# Patient Record
Sex: Female | Born: 1955
Health system: Southern US, Community
[De-identification: ages and names within clinical notes are randomized; demographics above are authoritative.]

## PROBLEM LIST (undated history)

## (undated) DIAGNOSIS — N200 Calculus of kidney: Secondary | ICD-10-CM

## (undated) DIAGNOSIS — Z87442 Personal history of urinary calculi: Secondary | ICD-10-CM

## (undated) DIAGNOSIS — E119 Type 2 diabetes mellitus without complications: Secondary | ICD-10-CM

## (undated) DIAGNOSIS — E785 Hyperlipidemia, unspecified: Secondary | ICD-10-CM

## (undated) DIAGNOSIS — M1990 Unspecified osteoarthritis, unspecified site: Secondary | ICD-10-CM

## (undated) DIAGNOSIS — N201 Calculus of ureter: Secondary | ICD-10-CM

## (undated) HISTORY — PX: JOINT REPLACEMENT: SHX530

## (undated) HISTORY — PX: OTHER SURGICAL HISTORY: SHX169

## (undated) HISTORY — PX: CATARACT EXTRACTION W/ INTRAOCULAR LENS  IMPLANT, BILATERAL: SHX1307

---

## 2014-04-29 ENCOUNTER — Emergency Department (HOSPITAL_COMMUNITY): Payer: Self-pay

## 2014-04-29 ENCOUNTER — Emergency Department (HOSPITAL_COMMUNITY)
Admission: EM | Admit: 2014-04-29 | Discharge: 2014-04-29 | Disposition: A | Discharge status: Home / Self Care | Payer: Self-pay | Attending: Emergency Medicine | Admitting: Emergency Medicine

## 2014-04-29 ENCOUNTER — Encounter (HOSPITAL_COMMUNITY): Payer: Self-pay | Admitting: Emergency Medicine

## 2014-04-29 DIAGNOSIS — E119 Type 2 diabetes mellitus without complications: Secondary | ICD-10-CM | POA: Insufficient documentation

## 2014-04-29 DIAGNOSIS — R197 Diarrhea, unspecified: Secondary | ICD-10-CM | POA: Insufficient documentation

## 2014-04-29 DIAGNOSIS — R509 Fever, unspecified: Secondary | ICD-10-CM | POA: Insufficient documentation

## 2014-04-29 DIAGNOSIS — N201 Calculus of ureter: Secondary | ICD-10-CM | POA: Insufficient documentation

## 2014-04-29 DIAGNOSIS — R1084 Generalized abdominal pain: Secondary | ICD-10-CM | POA: Insufficient documentation

## 2014-04-29 LAB — URINE MICROSCOPIC-ADD ON: RBC / HPF: NONE SEEN RBC/hpf (ref <3)

## 2014-04-29 LAB — COMPREHENSIVE METABOLIC PANEL
ALBUMIN: 4 g/dL (ref 3.5–5.2)
ALT: 20 U/L (ref 0–35)
AST: 18 U/L (ref 0–37)
Alkaline Phosphatase: 102 U/L (ref 39–117)
Anion gap: 17 — ABNORMAL HIGH (ref 5–15)
BUN: 11 mg/dL (ref 6–23)
CO2: 25 mEq/L (ref 19–32)
CREATININE: 0.66 mg/dL (ref 0.50–1.10)
Calcium: 9.8 mg/dL (ref 8.4–10.5)
Chloride: 95 mEq/L — ABNORMAL LOW (ref 96–112)
GFR calc Af Amer: >90 mL/min (ref >90)
Glucose, Bld: 230 mg/dL — ABNORMAL HIGH (ref 70–99)
Potassium: 3.8 mEq/L (ref 3.7–5.3)
Sodium: 137 mEq/L (ref 137–147)
Total Bilirubin: 0.9 mg/dL (ref 0.3–1.2)
Total Protein: 8.3 g/dL (ref 6.0–8.3)

## 2014-04-29 LAB — CBC WITH DIFFERENTIAL/PLATELET
BASOS ABS: 0 10*3/uL (ref 0.0–0.1)
BASOS PCT: 0 % (ref 0–1)
EOS PCT: 0 % (ref 0–5)
Eosinophils Absolute: 0 10*3/uL (ref 0.0–0.7)
HCT: 35.8 % — ABNORMAL LOW (ref 36.0–46.0)
Hemoglobin: 12 g/dL (ref 12.0–15.0)
Lymphocytes Relative: 16 % (ref 12–46)
Lymphs Abs: 1.9 10*3/uL (ref 0.7–4.0)
MCH: 28.2 pg (ref 26.0–34.0)
MCHC: 33.5 g/dL (ref 30.0–36.0)
MCV: 84 fL (ref 78.0–100.0)
Monocytes Absolute: 0.6 10*3/uL (ref 0.1–1.0)
Monocytes Relative: 5 % (ref 3–12)
Neutro Abs: 9.1 10*3/uL — ABNORMAL HIGH (ref 1.7–7.7)
Neutrophils Relative %: 79 % — ABNORMAL HIGH (ref 43–77)
Platelets: 314 10*3/uL (ref 150–400)
RBC: 4.26 MIL/uL (ref 3.87–5.11)
RDW: 13.4 % (ref 11.5–15.5)
WBC: 11.6 10*3/uL — ABNORMAL HIGH (ref 4.0–10.5)

## 2014-04-29 LAB — URINALYSIS, ROUTINE W REFLEX MICROSCOPIC
Bilirubin Urine: NEGATIVE
Ketones, ur: 15 mg/dL — AB
Nitrite: NEGATIVE
PH: 5 (ref 5.0–8.0)
PROTEIN: NEGATIVE mg/dL
Specific Gravity, Urine: 1.021 (ref 1.005–1.030)
Urobilinogen, UA: 0.2 mg/dL (ref 0.0–1.0)

## 2014-04-29 LAB — CBG MONITORING, ED: Glucose-Capillary: 191 mg/dL — ABNORMAL HIGH (ref 70–99)

## 2014-04-29 MED ORDER — SODIUM CHLORIDE 0.9 % IV BOLUS (SEPSIS)
1000.0000 mL | Freq: Once | INTRAVENOUS | Status: AC
  Administered 2014-04-29: 1000 mL via INTRAVENOUS

## 2014-04-29 MED ORDER — ONDANSETRON HCL 4 MG/2ML IJ SOLN
4.0000 mg | Freq: Once | INTRAMUSCULAR | Status: AC
  Administered 2014-04-29: 4 mg via INTRAVENOUS
  Filled 2014-04-29: qty 2

## 2014-04-29 MED ORDER — ACETAMINOPHEN 325 MG PO TABS
650.0000 mg | ORAL_TABLET | Freq: Once | ORAL | Status: AC
  Administered 2014-04-29: 650 mg via ORAL
  Filled 2014-04-29: qty 2

## 2014-04-29 MED ORDER — MORPHINE SULFATE 4 MG/ML IJ SOLN
4.0000 mg | Freq: Once | INTRAMUSCULAR | Status: AC
  Administered 2014-04-29: 4 mg via INTRAVENOUS
  Filled 2014-04-29: qty 1

## 2014-04-29 MED ORDER — OXYCODONE-ACETAMINOPHEN 5-325 MG PO TABS: 1.0000 | ORAL_TABLET | Freq: Four times a day (QID) | ORAL | Status: DC | PRN

## 2014-04-29 MED ORDER — ONDANSETRON HCL 4 MG PO TABS: 4.0000 mg | ORAL_TABLET | Freq: Four times a day (QID) | ORAL | Status: DC | PRN

## 2014-04-29 MED ORDER — TAMSULOSIN HCL 0.4 MG PO CAPS: 0.4000 mg | ORAL_CAPSULE | Freq: Once | ORAL | Status: DC

## 2014-04-29 NOTE — ED Notes (Signed)
Pt unable to void at present. Family at bedside, updated with pt permission.

## 2014-04-29 NOTE — Discharge Instructions (Signed)
Ureteral Colic Ureteral colic is spasm-like pain from the kidney or the ureter. This is often caused by a kidney stone. The pain is caused by the stone trying to get through the tubes that pass your pee. HOME CARE   Drink enough fluids to keep your pee (urine) clear or pale yellow.  Strain all your pee. A strainer will be provided. Keep anything caught in the strainer and bring it to your doctor. The stone causing the pain may be very small.  Only take medicine as told by your doctor.  Follow up with your doctor as told. GET HELP RIGHT AWAY IF:   Pain is not controlled with medicine.  Pain continues or gets worse.  The pain changes and there is chest or belly (abdominal) pain.  You pass out (faint).  You cannot pee.  You keep throwing up (vomiting).  You have a temperature by mouth above 102 F (38.9 C), not controlled by medicine. MAKE SURE YOU:   Understand these instructions.  Will watch this condition.  Will get help right away if you are not doing well or get worse. Document Released: 01/26/2008 Document Revised: 11/01/2011 Document Reviewed: 01/26/2008 Exit[employer] Patient Information 2015 [employer]. This information is not intended to replace advice given to you by your health care provider. Make sure you discuss any questions you have with your health care provider.  

## 2014-04-29 NOTE — ED Notes (Signed)
Per pt sts since last night she has been having abdominal pain, N,V,D. sts hx of kidney stone and feels the same.

## 2014-04-29 NOTE — ED Provider Notes (Signed)
CSN: [id number]     Arrival date & time 04/29/14  1052 History   First MD Initiated Contact with Patient 04/29/14 1101     Chief Complaint  Patient presents with  . Abdominal Pain  . Emesis  . Diarrhea     (Consider location/radiation/quality/duration/timing/severity/associated sxs/prior Treatment) Patient is a 58 y.o. female presenting with abdominal pain, vomiting, and diarrhea. The history is provided by the patient and a relative. The history is limited by a language barrier. A language interpreter was used.  Abdominal Pain Pain location:  Generalized Pain quality: aching   Pain radiates to:  L flank and R flank (L>R) Pain severity:  Moderate Onset quality:  Gradual Duration:  12 hours Timing:  Constant Progression:  Worsening Chronicity:  New Context: previous surgery   Context: not recent illness, not sick contacts, not suspicious food intake and not trauma   Relieved by:  Nothing Worsened by:  Nothing tried Ineffective treatments:  None tried Associated symptoms: anorexia, chills, diarrhea, fatigue, fever, nausea and vomiting   Associated symptoms: no chest pain, no cough, no dysuria, no shortness of breath and no sore throat   Diarrhea:    Quality:  Watery   Number of occurrences:  3   Timing:  Intermittent Vomiting:    Quality:  Bilious material   Number of occurrences:  5   Severity:  Moderate   Duration:  12 hours   Timing:  Intermittent   Progression:  Unchanged Emesis Severity:  Moderate Duration:  12 days Timing:  Intermittent Number of daily episodes:  5 Quality:  Bilious material Progression:  Unchanged Chronicity:  New Relieved by:  Nothing Associated symptoms: abdominal pain, chills and diarrhea   Associated symptoms: no arthralgias, no headaches and no sore throat   Diarrhea Associated symptoms: abdominal pain, chills, fever and vomiting   Associated symptoms: no arthralgias, no diaphoresis and no headaches     Past Medical History   Diagnosis Date  . Diabetes mellitus without complication    History reviewed. No pertinent past surgical history. History reviewed. No pertinent family history. History  Substance Use Topics  . Smoking status: Never Smoker   . Smokeless tobacco: Not on file  . Alcohol Use: No   OB History   Grav Para Term Preterm Abortions TAB SAB Ect Mult Living                 Review of Systems  Constitutional: Positive for fever, chills and fatigue. Negative for diaphoresis, activity change and appetite change.  HENT: Negative for congestion, facial swelling, rhinorrhea and sore throat.   Eyes: Negative for photophobia and discharge.  Respiratory: Negative for cough, chest tightness and shortness of breath.   Cardiovascular: Negative for chest pain, palpitations and leg swelling.  Gastrointestinal: Positive for nausea, vomiting, abdominal pain, diarrhea and anorexia.  Endocrine: Negative for polydipsia and polyuria.  Genitourinary: Positive for flank pain. Negative for dysuria, frequency, difficulty urinating and pelvic pain.  Musculoskeletal: Negative for arthralgias, back pain, neck pain and neck stiffness.  Skin: Negative for color change and wound.  Allergic/Immunologic: Negative for immunocompromised state.  Neurological: Negative for facial asymmetry, weakness, numbness and headaches.  Hematological: Does not bruise/bleed easily.  Psychiatric/Behavioral: Negative for confusion and agitation.      Allergies  Review of patient's allergies indicates no known allergies.  Home Medications   Prior to Admission medications   Medication Sig Start Date End Date Taking? Authorizing Provider  ondansetron (ZOFRAN) 4 MG tablet Take 1 tablet (4  mg total) by mouth every 6 (six) hours as needed for nausea or vomiting. 04/29/14   Toy Cookey, MD  oxyCODONE-acetaminophen (PERCOCET) 5-325 MG per tablet Take 1-2 tablets by mouth every 6 (six) hours as needed. 04/29/14   Toy Cookey, MD   tamsulosin (FLOMAX) 0.4 MG CAPS capsule Take 1 capsule (0.4 mg total) by mouth once. 04/29/14   Toy Cookey, MD   BP 116/81  Pulse 90  Temp(Src) 99.6 F (37.6 C) (Oral)  Resp 19  Wt 169 lb 12.1 oz (77 kg)  SpO2 98% Physical Exam  Constitutional: She is oriented to person, place, and time. She appears well-developed and well-nourished. No distress.  HENT:  Head: Normocephalic and atraumatic.  Mouth/Throat: No oropharyngeal exudate.  Eyes: Pupils are equal, round, and reactive to light.  Neck: Normal range of motion. Neck supple.  Cardiovascular: Normal rate, regular rhythm and normal heart sounds.  Exam reveals no gallop and no friction rub.   No murmur heard. Pulmonary/Chest: Effort normal and breath sounds normal. No respiratory distress. She has no wheezes. She has no rales.  Abdominal: Soft. Bowel sounds are normal. She exhibits no distension and no mass. There is generalized tenderness. There is CVA tenderness (L). There is no rigidity, no rebound and no guarding.  Musculoskeletal: Normal range of motion. She exhibits no edema and no tenderness.  Neurological: She is alert and oriented to person, place, and time.  Skin: Skin is warm and dry.  Psychiatric: She has a normal mood and affect.    ED Course  Procedures (including critical care time) Labs Review Labs Reviewed  CBC WITH DIFFERENTIAL - Abnormal; Notable for the following:    WBC 11.6 (*)    HCT 35.8 (*)    Neutrophils Relative % 79 (*)    Neutro Abs 9.1 (*)    All other components within normal limits  COMPREHENSIVE METABOLIC PANEL - Abnormal; Notable for the following:    Chloride 95 (*)    Glucose, Bld 230 (*)    Anion gap 17 (*)    All other components within normal limits  URINALYSIS, ROUTINE W REFLEX MICROSCOPIC - Abnormal; Notable for the following:    APPearance HAZY (*)    Glucose, UA >1000 (*)    Hgb urine dipstick TRACE (*)    Ketones, ur 15 (*)    Leukocytes, UA SMALL (*)    All other  components within normal limits  URINE MICROSCOPIC-ADD ON - Abnormal; Notable for the following:    Squamous Epithelial / LPF FEW (*)    Bacteria, UA FEW (*)    All other components within normal limits  CBG MONITORING, ED - Abnormal; Notable for the following:    Glucose-Capillary 191 (*)    All other components within normal limits  URINE CULTURE    Imaging Review Ct Renal Stone Study  04/29/2014   CLINICAL DATA:  Abdominal pain, bilateral low back and flank pain, nausea and vomiting.  EXAM: CT RENAL STONE PROTOCOL  TECHNIQUE: Multidetector CT imaging of the abdomen and pelvis was performed following the standard protocol without intravenous contrast  COMPARISON:  None.  FINDINGS: There is moderate left hydronephrosis with perinephric stranding present. Within the renal pelvis, a calculus is present measuring 13 x 8 x 9 mm. There is an additional 6 mm calculus in the distal ureter located approximately 3 cm proximal to the ureterovesical junction. No other left-sided renal or ureteral calculi identified. No evidence of right-sided renal calculi. The bladder is unremarkable.  Unenhanced appearance of the liver, gallbladder, pancreas and spleen are unremarkable. There is a low density rounded mass emanating from the lateral aspect of the left adrenal gland and measuring approximately 2.9 cm in greatest diameter. This lesion appears to contain internal fat with Hounsfield density measurement of -27 HU. Findings are consistent with adrenal adenoma or myelolipoma.  Bowel loops are unremarkable. No free fluid or abscess is identified. No hernias are seen. No enlarged lymph nodes. Bony structures are unremarkable.  IMPRESSION: 1. Left hydronephrosis with 6 mm distal ureteral calculus as well as a calculus in the renal pelvis measuring 13 x 8 x 9 mm. 2. 2.9 cm fat containing rounded lesion emanating from the lateral left adrenal gland. This is consistent with benign adenoma or myelolipoma.   Electronically  Signed   By: Irish Lack M.D.   On: 04/29/2014 13:22     EKG Interpretation None      MDM   Final diagnoses:  Ureterolithiasis    Pt is a 58 y.o. female with Pmhx as above who presents with generalized abdominal fine bilateral flank pain, left greater than right in associated nausea, vomiting, diarrhea. She also had a fever up to 101 yesterday. She's had about 5 episodes of nonbloody, nonbilious emesis as well as 3 watery stools. She reports history of kidney stone 2 years ago in [place] as well as history of an intestinal infection. On physical exam the patient has a low-grade temperature and mild tachycardia. Blood pressure stable. She is generalized abdominal tenderness to palpation with normal bowel sounds. No rebound or guarding. She has left CVA tenderness.   CT stone study shows L hydronephrosis with a 6mm distal ureteral calculus.  Urine not grossly infected. Cr nml. WBC mildly elevated at 11.6.  Pt feeling much improved after 1 dose of morphine/zofran and has tolerated liquids. Will d/c home w/ Rx for flomax, zofran, percocet.  Have asked her to f/u with urology.  Return precautions given for new or worsening symptoms including worsening pain, fever, inability to tolerate PO.       Toy Cookey, MD 04/30/14 [phone]

## 2014-04-29 NOTE — ED Notes (Signed)
Patient transported to XRAY 

## 2014-04-29 NOTE — ED Notes (Signed)
Pt reports lower back pain to right and left side, along with painful urination causing nausea and vomiting since last night. Pt states history of kidney infections.

## 2014-04-30 LAB — URINE CULTURE
Colony Count: 60000
Special Requests: NORMAL

## 2014-05-01 ENCOUNTER — Encounter (HOSPITAL_COMMUNITY): Payer: Self-pay | Admitting: Emergency Medicine

## 2014-05-01 ENCOUNTER — Emergency Department (HOSPITAL_COMMUNITY)
Admission: EM | Admit: 2014-05-01 | Discharge: 2014-05-01 | Disposition: A | Discharge status: Home / Self Care | Payer: Self-pay | Attending: Emergency Medicine | Admitting: Emergency Medicine

## 2014-05-01 DIAGNOSIS — N200 Calculus of kidney: Secondary | ICD-10-CM

## 2014-05-01 DIAGNOSIS — E119 Type 2 diabetes mellitus without complications: Secondary | ICD-10-CM | POA: Insufficient documentation

## 2014-05-01 DIAGNOSIS — R1031 Right lower quadrant pain: Secondary | ICD-10-CM | POA: Insufficient documentation

## 2014-05-01 DIAGNOSIS — Z79899 Other long term (current) drug therapy: Secondary | ICD-10-CM | POA: Insufficient documentation

## 2014-05-01 DIAGNOSIS — R112 Nausea with vomiting, unspecified: Secondary | ICD-10-CM | POA: Insufficient documentation

## 2014-05-01 LAB — CBC WITH DIFFERENTIAL/PLATELET
BASOS ABS: 0 10*3/uL (ref 0.0–0.1)
Basophils Relative: 0 % (ref 0–1)
EOS ABS: 0 10*3/uL (ref 0.0–0.7)
EOS PCT: 0 % (ref 0–5)
HCT: 31.3 % — ABNORMAL LOW (ref 36.0–46.0)
Hemoglobin: 10.6 g/dL — ABNORMAL LOW (ref 12.0–15.0)
Lymphocytes Relative: 6 % — ABNORMAL LOW (ref 12–46)
Lymphs Abs: 0.7 10*3/uL (ref 0.7–4.0)
MCH: 27.7 pg (ref 26.0–34.0)
MCHC: 33.9 g/dL (ref 30.0–36.0)
MCV: 81.9 fL (ref 78.0–100.0)
Monocytes Absolute: 0.6 10*3/uL (ref 0.1–1.0)
Monocytes Relative: 6 % (ref 3–12)
Neutro Abs: 9.8 10*3/uL — ABNORMAL HIGH (ref 1.7–7.7)
Neutrophils Relative %: 88 % — ABNORMAL HIGH (ref 43–77)
Platelets: 312 10*3/uL (ref 150–400)
RBC: 3.82 MIL/uL — ABNORMAL LOW (ref 3.87–5.11)
RDW: 13.2 % (ref 11.5–15.5)
WBC: 11.1 10*3/uL — AB (ref 4.0–10.5)

## 2014-05-01 LAB — COMPREHENSIVE METABOLIC PANEL
ALT: 20 U/L (ref 0–35)
AST: 24 U/L (ref 0–37)
Albumin: 3.1 g/dL — ABNORMAL LOW (ref 3.5–5.2)
Alkaline Phosphatase: 131 U/L — ABNORMAL HIGH (ref 39–117)
Anion gap: 17 — ABNORMAL HIGH (ref 5–15)
BUN: 12 mg/dL (ref 6–23)
CALCIUM: 9.6 mg/dL (ref 8.4–10.5)
CO2: 24 mEq/L (ref 19–32)
Chloride: 92 mEq/L — ABNORMAL LOW (ref 96–112)
Creatinine, Ser: 0.81 mg/dL (ref 0.50–1.10)
GFR calc Af Amer: >90 mL/min (ref >90)
GFR calc non Af Amer: 79 mL/min — ABNORMAL LOW (ref >90)
Glucose, Bld: 438 mg/dL — ABNORMAL HIGH (ref 70–99)
Potassium: 4.3 mEq/L (ref 3.7–5.3)
Sodium: 133 mEq/L — ABNORMAL LOW (ref 137–147)
TOTAL PROTEIN: 7.7 g/dL (ref 6.0–8.3)
Total Bilirubin: 0.6 mg/dL (ref 0.3–1.2)

## 2014-05-01 LAB — URINALYSIS, ROUTINE W REFLEX MICROSCOPIC
Bilirubin Urine: NEGATIVE
Ketones, ur: 15 mg/dL — AB
LEUKOCYTES UA: NEGATIVE
Nitrite: NEGATIVE
Protein, ur: NEGATIVE mg/dL
SPECIFIC GRAVITY, URINE: 1.03 (ref 1.005–1.030)
Urobilinogen, UA: 0.2 mg/dL (ref 0.0–1.0)
pH: 5.5 (ref 5.0–8.0)

## 2014-05-01 LAB — URINE MICROSCOPIC-ADD ON

## 2014-05-01 MED ORDER — OXYCODONE-ACETAMINOPHEN 5-325 MG PO TABS
1.0000 | ORAL_TABLET | Freq: Once | ORAL | Status: AC
  Administered 2014-05-01: 1 via ORAL
  Filled 2014-05-01: qty 1

## 2014-05-01 MED ORDER — ONDANSETRON 4 MG PO TBDP
4.0000 mg | ORAL_TABLET | Freq: Once | ORAL | Status: AC
  Administered 2014-05-01: 4 mg via ORAL
  Filled 2014-05-01: qty 1

## 2014-05-01 MED ORDER — ONDANSETRON 4 MG PO TBDP
8.0000 mg | ORAL_TABLET | Freq: Once | ORAL | Status: AC
  Administered 2014-05-01: 8 mg via ORAL
  Filled 2014-05-01: qty 2

## 2014-05-01 NOTE — ED Notes (Signed)
The pt is c/o lt flank pain since Sunday  With nv.  She was seen here Sunday and dxd with kidney stones.  Her pain has continued and she has been vomiting with chills

## 2014-05-01 NOTE — Discharge Instructions (Signed)
Return to the emergency room with worsening of symptoms, new symptoms or with symptoms that are concerning, especially worsening pain, fevers, inability to keep down fluids, foul smelling urine. Continue to take medications as prescribed.  Follow up with [facility] as soon as possible. Call the above number to make appointment.

## 2014-05-01 NOTE — ED Provider Notes (Signed)
CSN: [id number]     Arrival date & time 05/01/14  1545 History   First MD Initiated Contact with Patient 05/01/14 1926     Chief Complaint  Patient presents with  . Flank Pain    HPI Interpretor used. Brinae Woods is a 58 y.o. female with PMH of nephrolithiasis and DM presenting with fever, chills with increasing left lower back pain, nausea and two episodes of vomiting. Pain comes and goes and is relieved by pain meds but it returns quickly. Pain better with lying down. Patient reports fever was 104 at home and patient took acetaminophen and NSAIDS. Patient temperature is 99.1 in ED. Patient endorses pain with urination but no hematuria or foul smelling odor. Patient was seen in ED two days ago with CT showing nephrolithiasis. Patient instructed to follow up with urology and has not done so.  Patient denies CP, abdominal pain, change in stool.    Past Medical History  Diagnosis Date  . Diabetes mellitus without complication    History reviewed. No pertinent past surgical history. No family history on file. History  Substance Use Topics  . Smoking status: Never Smoker   . Smokeless tobacco: Not on file  . Alcohol Use: No   OB History   Grav Para Term Preterm Abortions TAB SAB Ect Mult Living                 Review of Systems  Constitutional: Negative for fever and chills.  HENT: Negative for congestion and rhinorrhea.   Eyes: Negative for visual disturbance.  Respiratory: Negative for cough and shortness of breath.   Cardiovascular: Negative for chest pain and palpitations.  Gastrointestinal: Positive for nausea and vomiting. Negative for diarrhea.  Genitourinary: Positive for dysuria and flank pain. Negative for hematuria.  Musculoskeletal: Positive for back pain. Negative for gait problem.  Skin: Negative for rash.  Neurological: Negative for weakness and headaches.      Allergies  Review of patient's allergies indicates no known allergies.  Home Medications   Prior  to Admission medications   Medication Sig Start Date End Date Taking? Authorizing Provider  ondansetron (ZOFRAN) 4 MG tablet Take 1 tablet (4 mg total) by mouth every 6 (six) hours as needed for nausea or vomiting. 04/29/14   Toy Cookey, MD  oxyCODONE-acetaminophen (PERCOCET) 5-325 MG per tablet Take 1-2 tablets by mouth every 6 (six) hours as needed. 04/29/14   Toy Cookey, MD  tamsulosin (FLOMAX) 0.4 MG CAPS capsule Take 1 capsule (0.4 mg total) by mouth once. 04/29/14   Toy Cookey, MD   BP 113/93  Pulse 106  Temp(Src) 98.9 F (37.2 C) (Oral)  Resp 16  SpO2 97% Physical Exam  Nursing note and vitals reviewed. Constitutional: She appears well-developed and well-nourished. No distress.  HENT:  Head: Normocephalic and atraumatic.  Eyes: Conjunctivae and EOM are normal. Right eye exhibits no discharge. Left eye exhibits no discharge. No scleral icterus.  Cardiovascular: Normal rate, regular rhythm and normal heart sounds.   Pulmonary/Chest: Effort normal and breath sounds normal. No respiratory distress. She has no wheezes.  Abdominal: Soft. Bowel sounds are normal.  Mild tenderness to right lower abdomen. +BS. No rebound or guarding. Left back tenderness but no CVA tenderness. No midline tenderness.  Musculoskeletal: Normal range of motion. She exhibits no tenderness.  Neurological: She is alert. She exhibits normal muscle tone. Coordination normal.  Skin: Skin is warm and dry. She is not diaphoretic.  Psychiatric: She has a normal mood and affect. Her  behavior is normal.    ED Course  Procedures (including critical care time) Labs Review Labs Reviewed  CBC WITH DIFFERENTIAL - Abnormal; Notable for the following:    WBC 11.1 (*)    RBC 3.82 (*)    Hemoglobin 10.6 (*)    HCT 31.3 (*)    Neutrophils Relative % 88 (*)    Neutro Abs 9.8 (*)    Lymphocytes Relative 6 (*)    All other components within normal limits  COMPREHENSIVE METABOLIC PANEL - Abnormal; Notable for the  following:    Sodium 133 (*)    Chloride 92 (*)    Glucose, Bld 438 (*)    Albumin 3.1 (*)    Alkaline Phosphatase 131 (*)    GFR calc non Af Amer 79 (*)    Anion gap 17 (*)    All other components within normal limits  URINALYSIS, ROUTINE W REFLEX MICROSCOPIC - Abnormal; Notable for the following:    Glucose, UA >1000 (*)    Hgb urine dipstick TRACE (*)    Ketones, ur 15 (*)    All other components within normal limits  URINE MICROSCOPIC-ADD ON    Imaging Review No results found.   EKG Interpretation None     Meds given in ED:  Medications  ondansetron (ZOFRAN-ODT) disintegrating tablet 8 mg (8 mg Oral Given 05/01/14 1638)  ondansetron (ZOFRAN-ODT) disintegrating tablet 4 mg (4 mg Oral Given 05/01/14 2034)  oxyCODONE-acetaminophen (PERCOCET/ROXICET) 5-325 MG per tablet 1-2 tablet (1 tablet Oral Given 05/01/14 2034)    Discharge Medication List as of 05/01/2014  9:15 PM        MDM   Final diagnoses:  Nephrolithiasis   Baudelia Birnie is a 58 y.o. female with PMH of nephrolithiasis, DM presenting with fevers, chills and worsening left flank pain. VSS. Patient afebrile in ED. Patient with flank pain but no CVA tenderness. Abdomen soft. Patient seen 04/29/14 for similar pain and CT showed two calculus in ureter and renal pelvis. Patient given Zofran and percocet in ED and tolerated fluids well. UA without signs of infection. Labs largely unchanged from 9/7. Patient is afebrile, nontoxic, and in no acute distress. Patient is appropriate for outpatient management and is stable for discharge. Continue to take Flomax, zofran and percocet at home and schedule urology appointment as soon as possible.    Discussed return precautions with patient. Discussed all results and patient verbalizes understanding and agrees with plan.  Case has been discussed with Dr. Judd Lien who agrees with the above plan to discharge.      Louann Sjogren, PA-C 05/01/14 2312

## 2014-05-02 NOTE — ED Provider Notes (Signed)
Medical screening examination/treatment/procedure(s) were performed by non-physician practitioner and as supervising physician I was immediately available for consultation/collaboration.     Lajada Janes, MD 05/02/14 0116 

## 2014-05-03 ENCOUNTER — Encounter (HOSPITAL_COMMUNITY): Payer: Self-pay | Admitting: *Deleted

## 2014-05-03 ENCOUNTER — Encounter (HOSPITAL_COMMUNITY)
Admission: AD | Disposition: A | Discharge status: Home / Self Care | Payer: Self-pay | Source: Ambulatory Visit | Attending: Urology

## 2014-05-03 ENCOUNTER — Ambulatory Visit (HOSPITAL_COMMUNITY)
Admission: AD | Admit: 2014-05-03 | Discharge: 2014-05-03 | Disposition: A | Discharge status: Home / Self Care | Payer: Self-pay | Source: Ambulatory Visit | Attending: Urology | Admitting: Urology

## 2014-05-03 ENCOUNTER — Other Ambulatory Visit: Payer: Self-pay | Admitting: Urology

## 2014-05-03 ENCOUNTER — Encounter (HOSPITAL_COMMUNITY): Payer: Self-pay | Admitting: Anesthesiology

## 2014-05-03 ENCOUNTER — Ambulatory Visit (HOSPITAL_COMMUNITY): Payer: Self-pay | Admitting: Anesthesiology

## 2014-05-03 DIAGNOSIS — E785 Hyperlipidemia, unspecified: Secondary | ICD-10-CM | POA: Insufficient documentation

## 2014-05-03 DIAGNOSIS — N200 Calculus of kidney: Secondary | ICD-10-CM

## 2014-05-03 DIAGNOSIS — H409 Unspecified glaucoma: Secondary | ICD-10-CM | POA: Insufficient documentation

## 2014-05-03 DIAGNOSIS — D649 Anemia, unspecified: Secondary | ICD-10-CM | POA: Insufficient documentation

## 2014-05-03 DIAGNOSIS — E119 Type 2 diabetes mellitus without complications: Secondary | ICD-10-CM | POA: Insufficient documentation

## 2014-05-03 DIAGNOSIS — I10 Essential (primary) hypertension: Secondary | ICD-10-CM | POA: Insufficient documentation

## 2014-05-03 DIAGNOSIS — Z79899 Other long term (current) drug therapy: Secondary | ICD-10-CM | POA: Insufficient documentation

## 2014-05-03 DIAGNOSIS — N201 Calculus of ureter: Secondary | ICD-10-CM | POA: Insufficient documentation

## 2014-05-03 HISTORY — DX: Unspecified osteoarthritis, unspecified site: M19.90

## 2014-05-03 HISTORY — PX: CYSTOSCOPY W/ URETERAL STENT PLACEMENT: SHX1429

## 2014-05-03 LAB — GLUCOSE, CAPILLARY
Glucose-Capillary: 197 mg/dL — ABNORMAL HIGH (ref 70–99)
Glucose-Capillary: 172 mg/dL — ABNORMAL HIGH (ref 70–99)

## 2014-05-03 SURGERY — CYSTOSCOPY, WITH RETROGRADE PYELOGRAM AND URETERAL STENT INSERTION
Anesthesia: General | Site: Ureter | Laterality: Left

## 2014-05-03 MED ORDER — FENTANYL CITRATE 0.05 MG/ML IJ SOLN
INTRAMUSCULAR | Status: AC
  Filled 2014-05-03: qty 2

## 2014-05-03 MED ORDER — LIDOCAINE HCL (CARDIAC) 20 MG/ML IV SOLN
INTRAVENOUS | Status: AC
  Filled 2014-05-03: qty 5

## 2014-05-03 MED ORDER — BELLADONNA ALKALOIDS-OPIUM 16.2-60 MG RE SUPP
RECTAL | Status: AC
  Filled 2014-05-03: qty 1

## 2014-05-03 MED ORDER — PROPOFOL 10 MG/ML IV BOLUS
INTRAVENOUS | Status: AC
  Filled 2014-05-03: qty 20

## 2014-05-03 MED ORDER — FENTANYL CITRATE 0.05 MG/ML IJ SOLN
25.0000 ug | INTRAMUSCULAR | Status: DC | PRN
  Administered 2014-05-03: 25 ug via INTRAVENOUS

## 2014-05-03 MED ORDER — PROMETHAZINE HCL 25 MG/ML IJ SOLN: 6.2500 mg | INTRAMUSCULAR | Status: DC | PRN

## 2014-05-03 MED ORDER — CIPROFLOXACIN HCL 500 MG PO TABS: 500.0000 mg | ORAL_TABLET | Freq: Two times a day (BID) | ORAL | Status: AC

## 2014-05-03 MED ORDER — TROSPIUM CHLORIDE ER 60 MG PO CP24: 60.0000 mg | ORAL_CAPSULE | Freq: Every day | ORAL | Status: DC

## 2014-05-03 MED ORDER — GENTAMICIN SULFATE 40 MG/ML IJ SOLN
5.0000 mg/kg | INTRAVENOUS | Status: AC
  Administered 2014-05-03: 290 mg via INTRAVENOUS
  Filled 2014-05-03: qty 7.25

## 2014-05-03 MED ORDER — LIDOCAINE HCL (CARDIAC) 20 MG/ML IV SOLN
INTRAVENOUS | Status: DC | PRN
  Administered 2014-05-03: 50 mg via INTRAVENOUS

## 2014-05-03 MED ORDER — FENTANYL CITRATE 0.05 MG/ML IJ SOLN
INTRAMUSCULAR | Status: DC | PRN
  Administered 2014-05-03 (×2): 50 ug via INTRAVENOUS

## 2014-05-03 MED ORDER — IOHEXOL 300 MG/ML  SOLN
INTRAMUSCULAR | Status: DC | PRN
  Administered 2014-05-03: 10 mL via INTRAVENOUS

## 2014-05-03 MED ORDER — PHENAZOPYRIDINE HCL 200 MG PO TABS: 200.0000 mg | ORAL_TABLET | Freq: Three times a day (TID) | ORAL | Status: DC | PRN

## 2014-05-03 MED ORDER — MIDAZOLAM HCL 2 MG/2ML IJ SOLN
INTRAMUSCULAR | Status: AC
  Filled 2014-05-03: qty 2

## 2014-05-03 MED ORDER — PROPOFOL 10 MG/ML IV BOLUS
INTRAVENOUS | Status: DC | PRN
  Administered 2014-05-03: 160 mg via INTRAVENOUS

## 2014-05-03 MED ORDER — MIDAZOLAM HCL 5 MG/5ML IJ SOLN
INTRAMUSCULAR | Status: DC | PRN
  Administered 2014-05-03: 2 mg via INTRAVENOUS

## 2014-05-03 MED ORDER — ONDANSETRON HCL 4 MG/2ML IJ SOLN
INTRAMUSCULAR | Status: AC
  Filled 2014-05-03: qty 2

## 2014-05-03 MED ORDER — ONDANSETRON HCL 4 MG/2ML IJ SOLN
INTRAMUSCULAR | Status: DC | PRN
  Administered 2014-05-03: 4 mg via INTRAVENOUS

## 2014-05-03 MED ORDER — LACTATED RINGERS IV SOLN
INTRAVENOUS | Status: DC
  Administered 2014-05-03: 1000 mL via INTRAVENOUS

## 2014-05-03 MED ORDER — BELLADONNA ALKALOIDS-OPIUM 16.2-60 MG RE SUPP
RECTAL | Status: DC | PRN
  Administered 2014-05-03: 1 via RECTAL

## 2014-05-03 SURGICAL SUPPLY — 15 items
BAG URO CATCHER STRL LF (DRAPE) ×4 IMPLANT
CATH URET 5FR 28IN CONE TIP (BALLOONS)
CATH URET 5FR 28IN OPEN ENDED (CATHETERS) ×4 IMPLANT
CATH URET 5FR 70CM CONE TIP (BALLOONS) IMPLANT
CLOTH BEACON ORANGE TIMEOUT ST (SAFETY) ×4 IMPLANT
DRAPE CAMERA CLOSED 9X96 (DRAPES) ×4 IMPLANT
GLOVE BIOGEL M STRL SZ7.5 (GLOVE) ×4 IMPLANT
GOWN STRL REUS W/TWL XL LVL3 (GOWN DISPOSABLE) ×4 IMPLANT
GUIDEWIRE STR DUAL SENSOR (WIRE) ×4 IMPLANT
MANIFOLD NEPTUNE II (INSTRUMENTS) ×4 IMPLANT
PACK CYSTO (CUSTOM PROCEDURE TRAY) ×4 IMPLANT
STENT URET 6FRX24 CONTOUR (STENTS) ×4 IMPLANT
TUBING CONNECTING 10 (TUBING) ×3 IMPLANT
TUBING CONNECTING 10' (TUBING) ×1
WIRE COONS/BENSON .038X145CM (WIRE) IMPLANT

## 2014-05-03 NOTE — Interval H&P Note (Signed)
History and Physical Interval Note: Left stent placement. 05/03/2014 4:51 PM  Laura Craig  has presented today for surgery, with the diagnosis of left ureteral stones  The various methods of treatment have been discussed with the patient and family. After consideration of risks, benefits and other options for treatment, the patient has consented to  Procedure(s): CYSTOSCOPY WITH DOUBLE J STENT PLACEMENT (Left) as a surgical intervention .  The patient's history has been reviewed, patient examined, no change in status, stable for surgery.  I have reviewed the patient's chart and labs.  Questions were answered to the patient's satisfaction.     Berniece Salines W

## 2014-05-03 NOTE — H&P (Signed)
History of Present Illness 58 year old female, native of [place] who has been in the [place] for one month comes in today with a three-day history of left flank pain, nausea, vomiting and fever. The patient has presented twice to the emergency room at [facility] within the past few days. She has been diagnosed with a 6 mm left distal ureteral stone as well as a larger left renal pelvic stone. Apparently, 2-3 years ago while in [place], she saw a urologist who could not treat the stone-she had an anesthetic procedure which apparently was aborted. There is no long-standing history of kidney stones. She has been treated for several urinary tract infections over the past year or 2. She denies gross hematuria.    She is diabetic. There is no history of cardiac disease. Apparently, there is no history of hypertension, although her son-in-law, who accompanies her today, says that her blood pressure "goes up and down".   Past Medical History Problems  1. History of diabetes mellitus (V12.29) 2. History of glaucoma (V12.49) 3. History of hypercholesterolemia (V12.29) 4. History of hypertension (V12.59)  Surgical History Problems  1. History of Eye Surgery 2. History of Lithotripsy  Current Meds 1. Flomax 0.4 MG Oral Capsule;  Therapy: (Recorded:11Sep2015) to Recorded 2. MetFORMIN HCl TABS;  Therapy: (Recorded:11Sep2015) to Recorded 3. Ondansetron 4 MG Oral Tablet Dispersible;  Therapy: (Recorded:11Sep2015) to Recorded 4. Oxycodone-Acetaminophen TABS;  Therapy: (Recorded:11Sep2015) to Recorded  Allergies Medication  1. No Known Drug Allergies  Family History Problems  1. Family history of kidney stones (V18.69) : Father  Social History Problems    Denied: History of Alcohol use   Denied: History of Caffeine use   Married   Never a smoker   Number of children   2 sons, 4 daughters  Review of Systems  Genitourinary: urinary frequency, urinary urgency, dysuria,  nocturia and urinary hesitancy.  Gastrointestinal: nausea, vomiting, diarrhea and constipation.  Constitutional: fever, night sweats and feeling tired (fatigue).  Musculoskeletal: back pain and joint pain.  Neurological: dizziness and headache.    Vitals Vital Signs [Data Includes: Last 1 Day]  Recorded: 11Sep2015 10:09AM  Height: 5 ft  Weight: 169 lb 12.05 oz BMI Calculated: 33.15 BSA Calculated: 1.74 Blood Pressure: 144 / 83 Temperature: 98.6 F Heart Rate: 108  Physical Exam Constitutional: Well nourished and well developed . In acute distress.  ENT:. The ears and nose are normal in appearance.  Neck: The appearance of the neck is normal and no neck mass is present.  Pulmonary: No respiratory distress and normal respiratory rhythm and effort.  Cardiovascular: Heart rate and rhythm are normal . No peripheral edema.  Abdomen: The abdomen is mildly obese. Moderate tenderness in the LUQ is present and tenderness in the LLQ is present. moderate left CVA tenderness.  Lymphatics: The femoral and inguinal nodes are not enlarged or tender.  Skin: Normal skin turgor, no visible rash and no visible skin lesions.  Neuro/Psych:. Mood and affect are appropriate.    Results/Data Urine [Data Includes: Last 1 Day]   11Sep2015  COLOR YELLOW   APPEARANCE CLOUDY   SPECIFIC GRAVITY <1.005   pH 6.0   GLUCOSE > 1000 mg/dL  BILIRUBIN NEG   KETONE 15 mg/dL  BLOOD SMALL   PROTEIN 30 mg/dL  UROBILINOGEN 0.2 mg/dL  NITRITE NEG   LEUKOCYTE ESTERASE TRACE   SQUAMOUS EPITHELIAL/HPF RARE   WBC TNTC WBC/hpf  RBC 0-2 RBC/hpf  BACTERIA MODERATE   CRYSTALS NONE SEEN   CASTS NONE  SEEN    Urinalysis appears infected. CT images were reviewed with the patient and her son-in-law. There is a 10 mm left renal pelvic stone, mild left hydroureteronephrosis down to a 6 mm distal left ureteral stone.   Assessment Assessed  1. Nephrolithiasis (592.0) 2. Left ureteral calculus (592.1) 3. Pyuria  (791.9)  1. Left distal ureteral stone, symptomatic with pyuria and hydronephrosis    2. Left renal pelvic stone   Plan Health Maintenance  1. UA With REFLEX; [Do Not Release]; Status:Complete;   Done: 11Sep2015 09:45AM Pyuria  2. Administer: CefTRIAXone Sodium 500 MG Injection Solution Reconstituted;  given  IM LUOQ; To Be Done: 11Sep2015 3. Follow-up Schedule Surgery Office  Follow-up  Status: Hold For - Appointment   Requested for: 11Sep2015  Discussion/Summary With the patient's recent history of fever, left flank pain and obstructing left ureteral stone, I have recommended that we urgently place a double-J stent. She will be given Rocephin here in the office. I have spoken with both the patient and her son-in-law about placing a stent now, and performing stone management after given a chance to treat the infection and adequate stent placement. A brochure about stent placement was given to the patient. They seem to understand what is going on.    I will have Dr. Marlou Porch place the stent, I will follow up with treatment down the road.

## 2014-05-03 NOTE — Discharge Instructions (Signed)
DISCHARGE INSTRUCTIONS FOR KIDNEY STONE/URETERAL STENT   MEDICATIONS:  1.  Resume all your other meds from home - except do not take any extra narcotic pain meds that you may have at home.  2. Trospium is to prevent bladder spasms and help reduce urinary frequency. 3. Pyridium is to help with the burning/stinging when you urinate. 4. Tylenol with codeine is for moderate/severe pain, otherwise taking upto  every 6 hours of plainTylenol will help treat your pain.  Do not take both at the same time. 5. Take Cipro one hour prior to removal of your stent.   ACTIVITY:  1. No strenuous activity x 1week  2. No driving while on narcotic pain medications  3. Drink plenty of water  4. Continue to walk at home - you can still get blood clots when you are at home, so keep active, but don't over do it.  5. May return to work/school tomorrow or when you feel ready   BATHING:  1. You can shower and we recommend daily showers  2. You have a string coming from your urethra: The stent string is attached to your ureteral stent. Do not pull on this.   SIGNS/SYMPTOMS TO CALL:  Please call us if you have a fever greater than 101.5, uncontrolled nausea/vomiting, uncontrolled pain, dizziness, unable to urinate, bloody urine, chest pain, shortness of breath, leg swelling, leg pain, redness around wound, drainage from wound, or any other concerns or questions.   You can reach [place] at [phone].   FOLLOW-UP:  1. You have follow-up scheduled with Dr. Retta Diones on 05/20/14 at 12:30pm

## 2014-05-03 NOTE — Anesthesia Preprocedure Evaluation (Addendum)
Anesthesia Evaluation  Patient identified by MRN, date of birth, ID band Patient awake    Reviewed: Allergy & Precautions, H&P , NPO status , Patient's Chart, lab work & pertinent test results  Airway Mallampati: II TM Distance: >3 FB Neck ROM: Full    Dental no notable dental hx.    Pulmonary neg pulmonary ROS,  breath sounds clear to auscultation  Pulmonary exam normal       Cardiovascular hypertension, Rhythm:Regular Rate:Normal  Probable untreated htn   Neuro/Psych negative neurological ROS  negative psych ROS   GI/Hepatic negative GI ROS, Neg liver ROS,   Endo/Other  diabetes  Renal/GU negative Renal ROS  negative genitourinary   Musculoskeletal negative musculoskeletal ROS (+)   Abdominal   Peds negative pediatric ROS (+)  Hematology  (+) anemia ,   Anesthesia Other Findings   Reproductive/Obstetrics negative OB ROS                          Anesthesia Physical Anesthesia Plan  ASA: III  Anesthesia Plan: General   Post-op Pain Management:    Induction: Intravenous  Airway Management Planned: Oral ETT and LMA  Additional Equipment:   Intra-op Plan:   Post-operative Plan: Extubation in OR  Informed Consent: I have reviewed the patients History and Physical, chart, labs and discussed the procedure including the risks, benefits and alternatives for the proposed anesthesia with the patient or authorized representative who has indicated his/her understanding and acceptance.   Dental advisory given  Plan Discussed with: CRNA and Surgeon  Anesthesia Plan Comments:        Anesthesia Quick Evaluation

## 2014-05-03 NOTE — Transfer of Care (Signed)
Immediate Anesthesia Transfer of Care Note  Patient: Laura Craig  Procedure(s) Performed: Procedure(s): CYSTOSCOPY WITH RETROGRADE PYELOGRAM/URETERAL STENT PLACEMENT (Left)  Patient Location: PACU  Anesthesia Type:General  Level of Consciousness: awake, alert  and oriented  Airway & Oxygen Therapy: Patient Spontanous Breathing and Patient connected to face mask oxygen  Post-op Assessment: Report given to PACU RN and Post -op Vital signs reviewed and stable  Post vital signs: Reviewed and stable  Complications: No apparent anesthesia complications

## 2014-05-03 NOTE — Op Note (Signed)
Preoperative diagnosis:  1. Left obstructing ureteral stone with fever   Postoperative diagnosis:  1. As above   Procedure:  1. Cystoscopy 2. left ureteral stent placement 3. left retrograde pyelography with interpretation   Surgeon: Crist Fat, MD  Anesthesia: General  Complications: None  Intraoperative findings: mild nephrotic drip, urine was clear coming behind stone.  24cm x 79F stent placed without difficulty.  EBL: Minimal  Specimens: urine culture from left renal pelvis  Indication: Laura Craig is a 58 y.o. patient with Left obstructing ureteral stone with fever and pain. After reviewing the management options for treatment, she elected to proceed with the above surgical procedure(s). We have discussed the potential benefits and risks of the procedure, side effects of the proposed treatment, the likelihood of the patient achieving the goals of the procedure, and any potential problems that might occur during the procedure or recuperation. Informed consent has been obtained.  Description of procedure:  The patient was taken to the operating room and general anesthesia was induced.  The patient was placed in the dorsal lithotomy position, prepped and draped in the usual sterile fashion, and preoperative antibiotics were administered. A preoperative time-out was performed.   Cystourethroscopy was performed.  The patient's urethra was examined and was normal. The bladder was then systematically examined in its entirety. There was no evidence for any bladder tumors, stones, or other mucosal pathology.    Attention then turned to the left ureteral orifice and a ureteral catheter was used to intubate the ureteral orifice and a urine culture obtained by aspirating urine from behind the stone.  Omnipaque contrast was then injected through the ureteral catheter and a retrograde pyelogram was performed with findings as dictated above.  A 0.38 sensor guidewire was then advanced  up the left ureter into the renal pelvis under fluoroscopic guidance.  The wire was then backloaded through the cystoscope and a ureteral stent was advance over the wire using Seldinger technique.  The stent was positioned appropriately under fluoroscopic and cystoscopic guidance.  The wire was then removed with an adequate stent curl noted in the renal pelvis as well as in the bladder.  The bladder was then emptied and the procedure ended.  The patient appeared to tolerate the procedure well and without complications.  The patient was able to be awakened and transferred to the recovery unit in satisfactory condition.    Crist Fat, M.D.

## 2014-05-04 LAB — URINE CULTURE
CULTURE: NO GROWTH
Colony Count: NO GROWTH

## 2014-05-06 ENCOUNTER — Encounter (HOSPITAL_COMMUNITY): Payer: Self-pay | Admitting: Urology

## 2014-05-08 NOTE — Anesthesia Postprocedure Evaluation (Signed)
  Anesthesia Post-op Note  Patient: Laura Craig  Procedure(s) Performed: Procedure(s) (LRB): CYSTOSCOPY WITH RETROGRADE PYELOGRAM/URETERAL STENT PLACEMENT (Left)  Patient Location: PACU  Anesthesia Type: General  Level of Consciousness: awake and alert   Airway and Oxygen Therapy: Patient Spontanous Breathing  Post-op Pain: mild  Post-op Assessment: Post-op Vital signs reviewed, Patient's Cardiovascular Status Stable, Respiratory Function Stable, Patent Airway and No signs of Nausea or vomiting  Last Vitals:  Filed Vitals:   05/03/14 1853  BP: 117/54  Pulse: 86  Temp:   Resp: 16    Post-op Vital Signs: stable   Complications: No apparent anesthesia complications

## 2014-05-21 ENCOUNTER — Encounter (HOSPITAL_BASED_OUTPATIENT_CLINIC_OR_DEPARTMENT_OTHER): Payer: Self-pay | Admitting: *Deleted

## 2014-05-21 ENCOUNTER — Other Ambulatory Visit: Payer: Self-pay | Admitting: Urology

## 2014-05-23 ENCOUNTER — Encounter (HOSPITAL_BASED_OUTPATIENT_CLINIC_OR_DEPARTMENT_OTHER): Payer: Self-pay | Admitting: *Deleted

## 2014-05-23 NOTE — Progress Notes (Signed)
SPOKE W/  PT'S SON-N-LAW WHOM SPEAKS ENGLISH AND WILL ALSO BE PT'S INTERPRETOR DOS.  NPO AFTER MN. ARRIVE AT 0700. NEEDS EKG. CURRENT LAB RESULTS IN CHART AND EPIC. MAY TAKE PAIN/ NAUSEA RX IF NEEDED AM DOS W/ SIPS OF WATER.

## 2014-05-27 ENCOUNTER — Encounter (HOSPITAL_BASED_OUTPATIENT_CLINIC_OR_DEPARTMENT_OTHER): Payer: Self-pay | Admitting: Anesthesiology

## 2014-05-27 ENCOUNTER — Encounter (HOSPITAL_BASED_OUTPATIENT_CLINIC_OR_DEPARTMENT_OTHER)
Admission: RE | Disposition: A | Discharge status: Home / Self Care | Payer: Self-pay | Source: Ambulatory Visit | Attending: Urology

## 2014-05-27 ENCOUNTER — Ambulatory Visit (HOSPITAL_BASED_OUTPATIENT_CLINIC_OR_DEPARTMENT_OTHER): Payer: Self-pay | Admitting: Anesthesiology

## 2014-05-27 ENCOUNTER — Ambulatory Visit (HOSPITAL_BASED_OUTPATIENT_CLINIC_OR_DEPARTMENT_OTHER)
Admission: RE | Admit: 2014-05-27 | Discharge: 2014-05-27 | Disposition: A | Discharge status: Home / Self Care | Payer: Self-pay | Source: Ambulatory Visit | Attending: Urology | Admitting: Urology

## 2014-05-27 DIAGNOSIS — N202 Calculus of kidney with calculus of ureter: Secondary | ICD-10-CM | POA: Insufficient documentation

## 2014-05-27 DIAGNOSIS — E119 Type 2 diabetes mellitus without complications: Secondary | ICD-10-CM | POA: Insufficient documentation

## 2014-05-27 DIAGNOSIS — N200 Calculus of kidney: Secondary | ICD-10-CM

## 2014-05-27 DIAGNOSIS — M1990 Unspecified osteoarthritis, unspecified site: Secondary | ICD-10-CM | POA: Insufficient documentation

## 2014-05-27 DIAGNOSIS — E785 Hyperlipidemia, unspecified: Secondary | ICD-10-CM | POA: Insufficient documentation

## 2014-05-27 HISTORY — PX: CYSTOSCOPY WITH URETEROSCOPY: SHX5123

## 2014-05-27 HISTORY — DX: Hyperlipidemia, unspecified: E78.5

## 2014-05-27 HISTORY — DX: Calculus of ureter: N20.1

## 2014-05-27 HISTORY — DX: Personal history of urinary calculi: Z87.442

## 2014-05-27 HISTORY — PX: CYSTOSCOPY W/ URETERAL STENT PLACEMENT: SHX1429

## 2014-05-27 HISTORY — DX: Type 2 diabetes mellitus without complications: E11.9

## 2014-05-27 HISTORY — DX: Calculus of kidney: N20.0

## 2014-05-27 HISTORY — PX: CYSTOSCOPY W/ RETROGRADES: SHX1426

## 2014-05-27 HISTORY — PX: HOLMIUM LASER APPLICATION: SHX5852

## 2014-05-27 LAB — GLUCOSE, CAPILLARY
Glucose-Capillary: 267 mg/dL — ABNORMAL HIGH (ref 70–99)
Glucose-Capillary: 270 mg/dL — ABNORMAL HIGH (ref 70–99)

## 2014-05-27 SURGERY — HOLMIUM LASER APPLICATION
Anesthesia: General | Site: Ureter | Laterality: Left

## 2014-05-27 MED ORDER — ACETAMINOPHEN 650 MG RE SUPP
650.0000 mg | RECTAL | Status: DC | PRN
  Filled 2014-05-27: qty 1

## 2014-05-27 MED ORDER — DEXAMETHASONE SODIUM PHOSPHATE 4 MG/ML IJ SOLN
INTRAMUSCULAR | Status: DC | PRN
  Administered 2014-05-27: 8 mg via INTRAVENOUS

## 2014-05-27 MED ORDER — LACTATED RINGERS IV SOLN
INTRAVENOUS | Status: DC
  Administered 2014-05-27 (×2): via INTRAVENOUS
  Filled 2014-05-27: qty 1000

## 2014-05-27 MED ORDER — BELLADONNA ALKALOIDS-OPIUM 16.2-60 MG RE SUPP
RECTAL | Status: DC | PRN
  Administered 2014-05-27: 1 via RECTAL

## 2014-05-27 MED ORDER — SODIUM CHLORIDE 0.9 % IJ SOLN
3.0000 mL | INTRAMUSCULAR | Status: DC | PRN
  Filled 2014-05-27: qty 3

## 2014-05-27 MED ORDER — FENTANYL CITRATE 0.05 MG/ML IJ SOLN
INTRAMUSCULAR | Status: DC | PRN
  Administered 2014-05-27 (×2): 50 ug via INTRAVENOUS

## 2014-05-27 MED ORDER — LACTATED RINGERS IV SOLN
INTRAVENOUS | Status: DC
  Filled 2014-05-27: qty 1000

## 2014-05-27 MED ORDER — PROPOFOL 10 MG/ML IV BOLUS
INTRAVENOUS | Status: DC | PRN
  Administered 2014-05-27: 150 mg via INTRAVENOUS

## 2014-05-27 MED ORDER — SODIUM CHLORIDE 0.9 % IR SOLN
Status: DC | PRN
  Administered 2014-05-27: 4000 mL

## 2014-05-27 MED ORDER — MIDAZOLAM HCL 5 MG/5ML IJ SOLN
INTRAMUSCULAR | Status: DC | PRN
  Administered 2014-05-27: 2 mg via INTRAVENOUS

## 2014-05-27 MED ORDER — KETOROLAC TROMETHAMINE 30 MG/ML IJ SOLN
INTRAMUSCULAR | Status: DC | PRN
  Administered 2014-05-27: 30 mg via INTRAVENOUS

## 2014-05-27 MED ORDER — FENTANYL CITRATE 0.05 MG/ML IJ SOLN
INTRAMUSCULAR | Status: AC
  Filled 2014-05-27: qty 4

## 2014-05-27 MED ORDER — ACETAMINOPHEN 10 MG/ML IV SOLN
INTRAVENOUS | Status: DC | PRN
  Administered 2014-05-27: 1000 mg via INTRAVENOUS

## 2014-05-27 MED ORDER — TRAMADOL HCL 50 MG PO TABS: 50.0000 mg | ORAL_TABLET | Freq: Four times a day (QID) | ORAL | Status: DC | PRN

## 2014-05-27 MED ORDER — ACETAMINOPHEN 325 MG PO TABS
650.0000 mg | ORAL_TABLET | ORAL | Status: DC | PRN
  Filled 2014-05-27: qty 2

## 2014-05-27 MED ORDER — FENTANYL CITRATE 0.05 MG/ML IJ SOLN
25.0000 ug | INTRAMUSCULAR | Status: DC | PRN
  Filled 2014-05-27: qty 1

## 2014-05-27 MED ORDER — MIDAZOLAM HCL 2 MG/2ML IJ SOLN
INTRAMUSCULAR | Status: AC
  Filled 2014-05-27: qty 2

## 2014-05-27 MED ORDER — TRAMADOL HCL 50 MG PO TABS
ORAL_TABLET | ORAL | Status: AC
  Filled 2014-05-27: qty 1

## 2014-05-27 MED ORDER — PROMETHAZINE HCL 25 MG/ML IJ SOLN
6.2500 mg | INTRAMUSCULAR | Status: DC | PRN
  Filled 2014-05-27: qty 1

## 2014-05-27 MED ORDER — CEFAZOLIN SODIUM-DEXTROSE 2-3 GM-% IV SOLR
2.0000 g | INTRAVENOUS | Status: DC
  Filled 2014-05-27: qty 50

## 2014-05-27 MED ORDER — TRAMADOL HCL 50 MG PO TABS
50.0000 mg | ORAL_TABLET | Freq: Four times a day (QID) | ORAL | Status: DC
  Administered 2014-05-27: 50 mg via ORAL
  Filled 2014-05-27: qty 1

## 2014-05-27 MED ORDER — MEPERIDINE HCL 25 MG/ML IJ SOLN
6.2500 mg | INTRAMUSCULAR | Status: DC | PRN
  Filled 2014-05-27: qty 1

## 2014-05-27 MED ORDER — LIDOCAINE HCL (CARDIAC) 20 MG/ML IV SOLN
INTRAVENOUS | Status: DC | PRN
  Administered 2014-05-27: 50 mg via INTRAVENOUS

## 2014-05-27 MED ORDER — BELLADONNA ALKALOIDS-OPIUM 16.2-60 MG RE SUPP
RECTAL | Status: AC
  Filled 2014-05-27: qty 1

## 2014-05-27 MED ORDER — CIPROFLOXACIN HCL 250 MG PO TABS: 250.0000 mg | ORAL_TABLET | Freq: Two times a day (BID) | ORAL | Status: DC

## 2014-05-27 MED ORDER — ONDANSETRON HCL 4 MG/2ML IJ SOLN
INTRAMUSCULAR | Status: DC | PRN
  Administered 2014-05-27: 4 mg via INTRAVENOUS

## 2014-05-27 MED ORDER — SODIUM CHLORIDE 0.9 % IJ SOLN
3.0000 mL | Freq: Two times a day (BID) | INTRAMUSCULAR | Status: DC
  Filled 2014-05-27: qty 3

## 2014-05-27 MED ORDER — IOHEXOL 350 MG/ML SOLN
INTRAVENOUS | Status: DC | PRN
  Administered 2014-05-27: 2 mL

## 2014-05-27 MED ORDER — OXYCODONE HCL 5 MG PO TABS
5.0000 mg | ORAL_TABLET | ORAL | Status: DC | PRN
  Filled 2014-05-27: qty 2

## 2014-05-27 MED ORDER — CEFAZOLIN SODIUM 1-5 GM-% IV SOLN
1.0000 g | INTRAVENOUS | Status: DC
  Administered 2014-05-27: 2 g via INTRAVENOUS
  Filled 2014-05-27: qty 50

## 2014-05-27 MED ORDER — SODIUM CHLORIDE 0.9 % IV SOLN
250.0000 mL | INTRAVENOUS | Status: DC | PRN
  Filled 2014-05-27: qty 250

## 2014-05-27 MED ORDER — CEFAZOLIN SODIUM-DEXTROSE 2-3 GM-% IV SOLR
INTRAVENOUS | Status: AC
  Filled 2014-05-27: qty 50

## 2014-05-27 SURGICAL SUPPLY — 38 items
ADAPTER CATH URET PLST 4-6FR (CATHETERS) IMPLANT
BAG DRAIN URO-CYSTO SKYTR STRL (DRAIN) ×4 IMPLANT
BASKET LASER NITINOL 1.9FR (BASKET) IMPLANT
BASKET STNLS GEMINI 4WIRE 3FR (BASKET) IMPLANT
BASKET ZERO TIP NITINOL 2.4FR (BASKET) ×8 IMPLANT
CANISTER SUCT LVC 12 LTR MEDI- (MISCELLANEOUS) ×4 IMPLANT
CATH INTERMIT  6FR 70CM (CATHETERS) ×4 IMPLANT
CATH URET 5FR 28IN CONE TIP (BALLOONS)
CATH URET 5FR 28IN OPEN ENDED (CATHETERS) IMPLANT
CATH URET 5FR 70CM CONE TIP (BALLOONS) IMPLANT
CLOTH BEACON ORANGE TIMEOUT ST (SAFETY) ×4 IMPLANT
DRAPE CAMERA CLOSED 9X96 (DRAPES) ×4 IMPLANT
ELECT REM PT RETURN 9FT ADLT (ELECTROSURGICAL)
ELECTRODE REM PT RTRN 9FT ADLT (ELECTROSURGICAL) IMPLANT
FIBER LASER TRAC TIP (UROLOGICAL SUPPLIES) ×4 IMPLANT
GLOVE BIO SURGEON STRL SZ 6.5 (GLOVE) ×3 IMPLANT
GLOVE BIO SURGEON STRL SZ8 (GLOVE) ×4 IMPLANT
GLOVE BIO SURGEONS STRL SZ 6.5 (GLOVE) ×1
GLOVE BIOGEL PI IND STRL 6.5 (GLOVE) ×2 IMPLANT
GLOVE BIOGEL PI INDICATOR 6.5 (GLOVE) ×2
GOWN PREVENTION PLUS LG XLONG (DISPOSABLE) ×4 IMPLANT
GOWN STRL REIN XL XLG (GOWN DISPOSABLE) IMPLANT
GOWN STRL REUS W/TWL XL LVL3 (GOWN DISPOSABLE) ×4 IMPLANT
GUIDEWIRE 0.038 PTFE COATED (WIRE) IMPLANT
GUIDEWIRE ANG ZIPWIRE 038X150 (WIRE) IMPLANT
GUIDEWIRE STR DUAL SENSOR (WIRE) ×4 IMPLANT
IV NS 1000ML (IV SOLUTION) ×2
IV NS 1000ML BAXH (IV SOLUTION) ×2 IMPLANT
IV NS IRRIG 3000ML ARTHROMATIC (IV SOLUTION) ×4 IMPLANT
KIT BALLIN UROMAX 15FX10 (LABEL) IMPLANT
KIT BALLN UROMAX 15FX4 (MISCELLANEOUS) IMPLANT
KIT BALLN UROMAX 26 75X4 (MISCELLANEOUS)
NS IRRIG 500ML POUR BTL (IV SOLUTION) ×4 IMPLANT
PACK CYSTO (CUSTOM PROCEDURE TRAY) ×4 IMPLANT
SET HIGH PRES BAL DIL (LABEL)
SHEATH ACCESS URETERAL 38CM (SHEATH) ×4 IMPLANT
SHEATH ACCESS URETERAL 54CM (SHEATH) IMPLANT
STENT URET 6FRX24 CONTOUR (STENTS) ×4 IMPLANT

## 2014-05-27 NOTE — Anesthesia Preprocedure Evaluation (Signed)
Anesthesia Evaluation  Patient identified by MRN, date of birth, ID band Patient awake    Reviewed: Allergy & Precautions, H&P , NPO status , Patient's Chart, lab work & pertinent test results  Airway Mallampati: II TM Distance: >3 FB Neck ROM: Full    Dental no notable dental hx.    Pulmonary neg pulmonary ROS,  breath sounds clear to auscultation  Pulmonary exam normal       Cardiovascular hypertension, Rhythm:Regular Rate:Normal  Probable untreated htn   Neuro/Psych negative neurological ROS  negative psych ROS   GI/Hepatic negative GI ROS, Neg liver ROS,   Endo/Other  diabetes, Type 2, Oral Hypoglycemic Agents  Renal/GU negative Renal ROS  negative genitourinary   Musculoskeletal negative musculoskeletal ROS (+)   Abdominal   Peds negative pediatric ROS (+)  Hematology  (+) anemia ,   Anesthesia Other Findings   Reproductive/Obstetrics negative OB ROS                           Anesthesia Physical  Anesthesia Plan  ASA: III  Anesthesia Plan: General   Post-op Pain Management:    Induction: Intravenous  Airway Management Planned: Oral ETT and LMA  Additional Equipment:   Intra-op Plan:   Post-operative Plan: Extubation in OR  Informed Consent: I have reviewed the patients History and Physical, chart, labs and discussed the procedure including the risks, benefits and alternatives for the proposed anesthesia with the patient or authorized representative who has indicated his/her understanding and acceptance.   Dental advisory given  Plan Discussed with: CRNA and Surgeon  Anesthesia Plan Comments:         Anesthesia Quick Evaluation

## 2014-05-27 NOTE — Discharge Instructions (Signed)

## 2014-05-27 NOTE — Anesthesia Postprocedure Evaluation (Signed)
  Anesthesia Post-op Note  Patient: Laura Craig  Procedure(s) Performed: Procedure(s) (LRB): HOLMIUM LASER APPLICATION (Left) CYSTOSCOPY WITH STENT REPLACEMENT (Left) CYSTOSCOPY WITH URETEROSCOPY/ STONE EXTRACTION (Left) CYSTOSCOPY WITH RETROGRADE PYELOGRAM (Left)  Patient Location: PACU  Anesthesia Type: General  Level of Consciousness: awake and alert   Airway and Oxygen Therapy: Patient Spontanous Breathing  Post-op Pain: mild  Post-op Assessment: Post-op Vital signs reviewed, Patient's Cardiovascular Status Stable, Respiratory Function Stable, Patent Airway and No signs of Nausea or vomiting  Last Vitals:  Filed Vitals:   05/27/14 1145  BP: 131/67  Pulse: 89  Temp: 36.6 C  Resp: 17    Post-op Vital Signs: stable   Complications: No apparent anesthesia complications

## 2014-05-27 NOTE — Anesthesia Procedure Notes (Signed)
Procedure Name: LMA Insertion Date/Time: 05/27/2014 8:28 AM Performed by: Norva PavlovALLAWAY, Perla Echavarria G Pre-anesthesia Checklist: Patient identified, Emergency Drugs available, Suction available and Patient being monitored Patient Re-evaluated:Patient Re-evaluated prior to inductionOxygen Delivery Method: Circle System Utilized Preoxygenation: Pre-oxygenation with 100% oxygen Intubation Type: IV induction Ventilation: Mask ventilation without difficulty LMA: LMA inserted LMA Size: 4.0 Number of attempts: 1 Airway Equipment and Method: bite block Placement Confirmation: positive ETCO2 Tube secured with: Tape Dental Injury: Teeth and Oropharynx as per pre-operative assessment

## 2014-05-27 NOTE — Op Note (Signed)
Preoperative diagnosis: Left renal and left ureteral calculus  Postoperative diagnosis: Left renal calculus  Procedure:  1. Cystoscopy 2. Left ureteroscopy and stone removal 3. Ureteroscopic laser lithotripsy 4. Left ureteral stent placement (24 cm x 6 French contour without string) 5. Left retrograde pyelography with interpretation  Surgeon: Bertram MillardStephen M. Liberty Stead,  M.D.  Anesthesia: General  Complications: None  Intraoperative findings: Left retrograde pyelography demonstrated a filling defect within the left lower pole of the kidney consistent with the patient's known calculus without other abnormalities noted. There was no evidence of left ureteral stone.  EBL: Minimal  Specimens: 1. Left renal calculus  Disposition of specimens: [facility] for stone analysis  Indication: Laura Craig  is a 58 y.o. patient with urolithiasis. She initially presented about a month ago with pyuria and an obstructing left distal ureteral stone and left renal calculus, approximately 12-13 mm in size. She underwent urgent stent placement, antibiotic management, and at this point is presenting for left ureteroscopy, management of left ureteral and renal calculi. After reviewing the management options for treatment, he elected to proceed with the above surgical procedure(s). We have discussed the potential benefits and risks of the procedure, side effects of the proposed treatment, the likelihood of the patient achieving the goals of the procedure, and any potential problems that might occur during the procedure or recuperation. Informed consent has been obtained.  Description of procedure:  The patient was taken to the operating room and general anesthesia was induced.  The patient was placed in the dorsal lithotomy position, prepped and draped in the usual sterile fashion, and preoperative antibiotics were administered. A preoperative time-out was performed.   Cystourethroscopy was  performed.  There was evidence of female circumcision.  Attention then turned to the left ureteral orifice . The stent was in position. It was extracted through the urethra, and a guidewire was placed through this and up into the left renal pelvis. The stent was removed. A 12/14 Fr ureteral access sheath was then advance over the guide wire. The digital flexible ureteroscope was then advanced through the access sheath into the renal pelvis. The stone was identified in the lower pole calyx. All of the calyces were found to be normal.   The stone was then fragmented with the 200 micron holmium laser fiber on a setting of 0.8 J and frequency of 15 Hz.   All sizable stones were then removed with a zero tip nitinol basket.  Reinspection of the ureter/renal pelvis revealed no remaining visible stones or fragments of significant size.   The safety wire was then replaced and the access sheath removed.  The guidewire was backloaded through the cystoscope and a ureteral stent was advance over the wire using Seldinger technique.  The stent was positioned appropriately under fluoroscopic and cystoscopic guidance.  The wire was then removed with an adequate stent curl noted in the renal pelvis as well as in the bladder.  The bladder was then emptied and the procedure ended.  The patient appeared to tolerate the procedure well and without complications.  The patient was able to be awakened and transferred to the recovery unit in satisfactory condition.

## 2014-05-27 NOTE — H&P (Signed)
  H&P  Chief Complaint: Kidney stones  History of Present Illness: Laura Craig is a 58 y.o. year old female who presents for ureteroscopic management of a left ureteral and a left renal pelvic stone. She presented for management shortly after arriving here from [place] a month ago with pyuria and left flank pain. She  Was stented urgently and treated for her UTI. She now presents for management with ureteroscopy and laser/extraction of her calculi.  Past Medical History  Diagnosis Date  . Arthritis   . Left ureteral calculus   . Nephrolithiasis   . Hyperlipidemia   . Type 2 diabetes mellitus   . History of kidney stones     Past Surgical History  Procedure Laterality Date  . Cesarean section    . Laser lithotripsy    . Cystoscopy w/ ureteral stent placement Left 05/03/2014    Procedure: CYSTOSCOPY WITH RETROGRADE PYELOGRAM/URETERAL STENT PLACEMENT;  Surgeon: Crist FatBenjamin W Herrick, MD;  Location: [facility];  Service: Urology;  Laterality: Left;  . Cataract extraction w/ intraocular lens  implant, bilateral      Home Medications:  No prescriptions prior to admission    Allergies: No Known Allergies  History reviewed. No pertinent family history.  Social History:  reports that she has never smoked. She has never used smokeless tobacco. She reports that she does not drink alcohol or use illicit drugs.  ROS: A complete review of systems was performed.  All systems are negative except for pertinent findings as noted.  Physical Exam:  Vital signs in last 24 hours:   General:  Alert and oriented, No acute distress HEENT: Normocephalic, atraumatic Neck: No JVD or lymphadenopathy Cardiovascular: Regular rate and rhythm Lungs: Clear bilaterally Abdomen: Soft, nontender, nondistended, no abdominal masses Back: No CVA tenderness Extremities: No edema Neurologic: Grossly intact  Laboratory Data:  No results found for this or any previous visit (from the past 24 hour(s)). No results  found for this or any previous visit (from the past 240 hour(s)). Creatinine: No results found for this basename: CREATININE,  in the last 168 hours  Radiologic Imaging: No results found.  Impression/Assessment:  Left ureteral and renal calculi  Plan:  Left ureteroscopy, laser and extraction of calculi  Chelsea AusDAHLSTEDT, [name] 05/27/2014, 6:03 AM  Bertram MillardStephen M. Romar Woodrick MD

## 2014-05-27 NOTE — Transfer of Care (Signed)
Immediate Anesthesia Transfer of Care Note  Patient: Laura Craig  Procedure(s) Performed: Procedure(s) (LRB): HOLMIUM LASER APPLICATION (Left) CYSTOSCOPY WITH STENT REPLACEMENT (Left) CYSTOSCOPY WITH URETEROSCOPY/ STONE EXTRACTION (Left) CYSTOSCOPY WITH RETROGRADE PYELOGRAM (Left)  Patient Location: PACU  Anesthesia Type: General  Level of Consciousness: awake, alert  and oriented  Airway & Oxygen Therapy: Patient Spontanous Breathing and Patient connected to face mask oxygen  Post-op Assessment: Report given to PACU RN and Post -op Vital signs reviewed and stable  Post vital signs: Reviewed and stable  Complications: No apparent anesthesia complications

## 2014-05-28 ENCOUNTER — Encounter (HOSPITAL_BASED_OUTPATIENT_CLINIC_OR_DEPARTMENT_OTHER): Payer: Self-pay | Admitting: Urology

## 2016-03-31 IMAGING — CT CT RENAL STONE PROTOCOL
2 of 4 series · 12 of 46 positions shown, 14 images · non-contrast
Comparison: None.

CLINICAL DATA: Abdominal pain, bilateral low back and flank pain,
nausea and vomiting.

EXAM:
CT RENAL STONE PROTOCOL
TECHNIQUE: Multidetector CT imaging of the abdomen and pelvis was performed
following the standard protocol without intravenous contrast

[Series 201: stone study, idose (2) · axial · 0.68mm/px · z∈[-401,-36]mm · 9 of 87 slices shown, 11 images]
[im 7/87  soft-tissue]
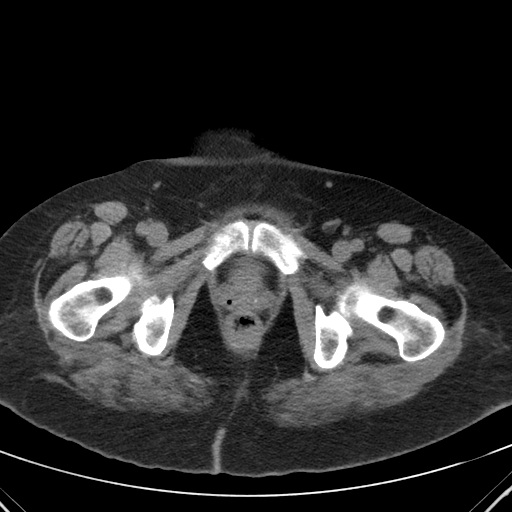
[im 7/87  bone]
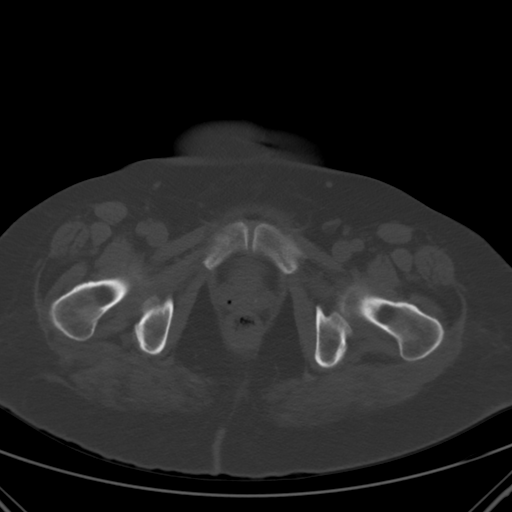
[im 18/87  soft-tissue]
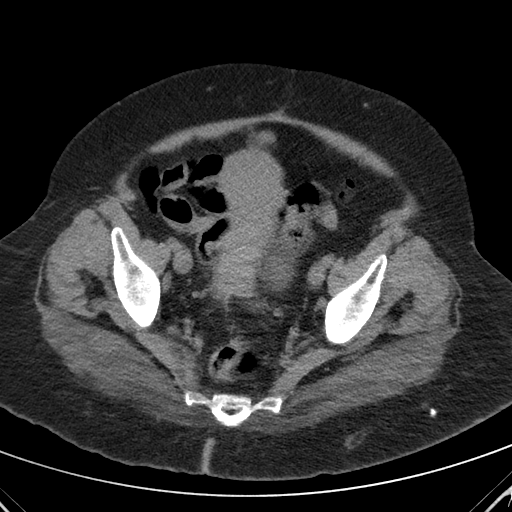
[im 25/87  soft-tissue]
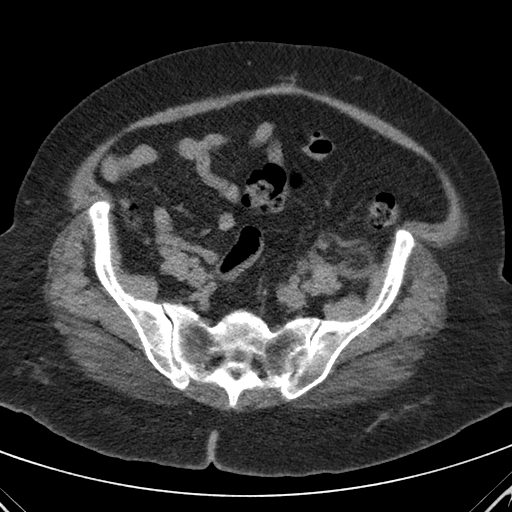
[im 35/87  soft-tissue]
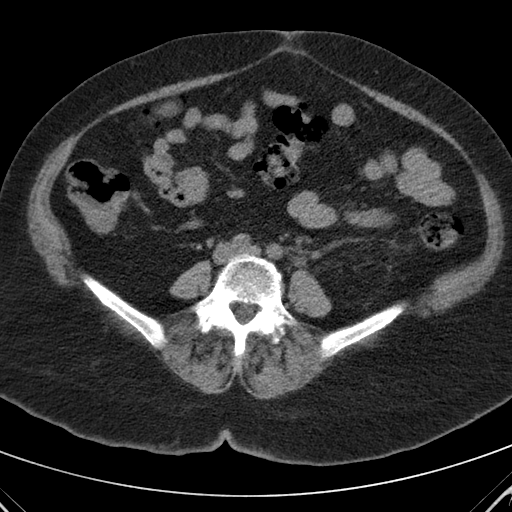
[im 45/87  soft-tissue]
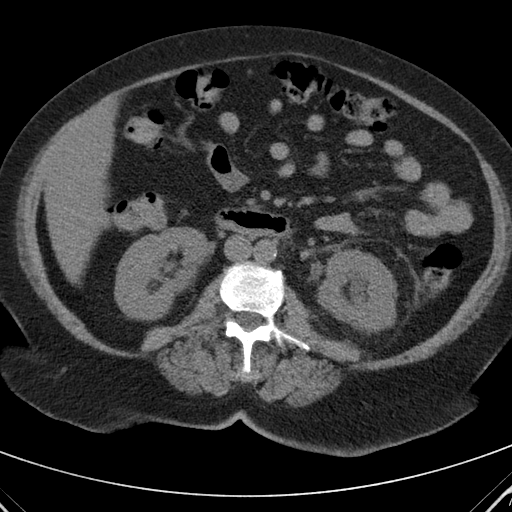
[im 52/87  soft-tissue]
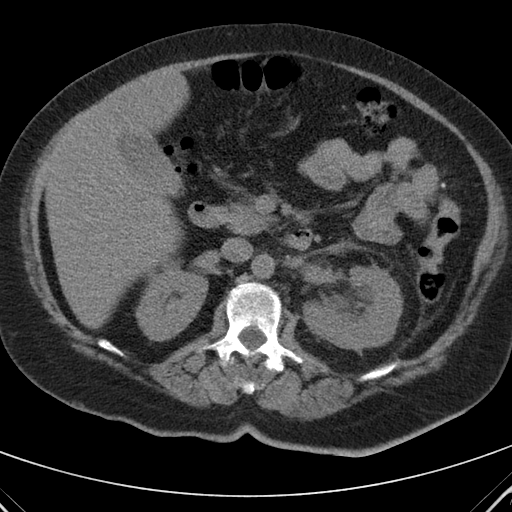
[im 62/87  soft-tissue]
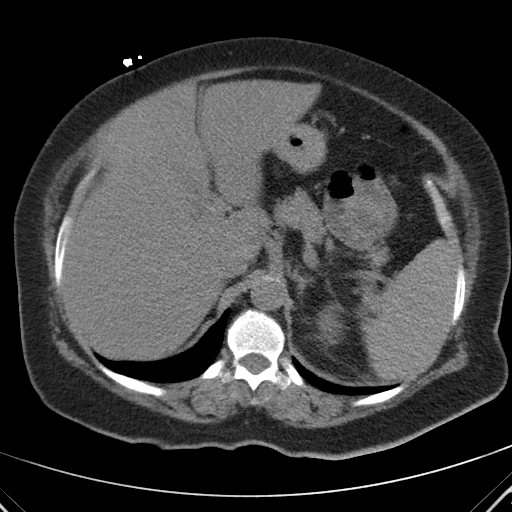
[im 73/87  soft-tissue]
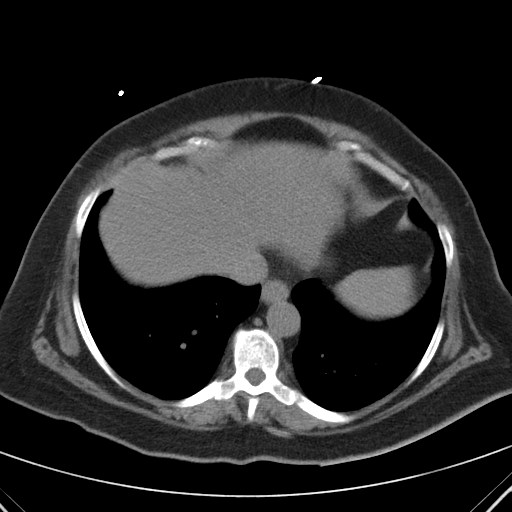
[im 80/87  soft-tissue]
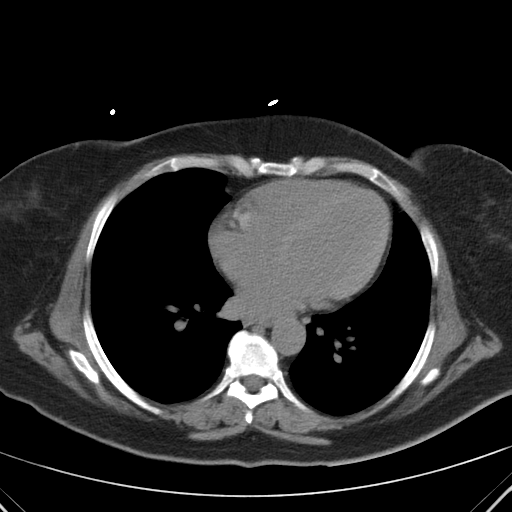
[im 80/87  bone]
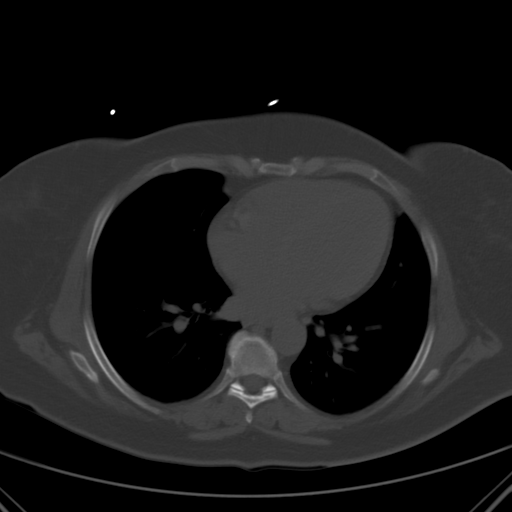

[Series 202: coronals, idose (2) · coronal · 0.45mm/px · 3 of 103 slices shown]
[im 35/103  soft-tissue]
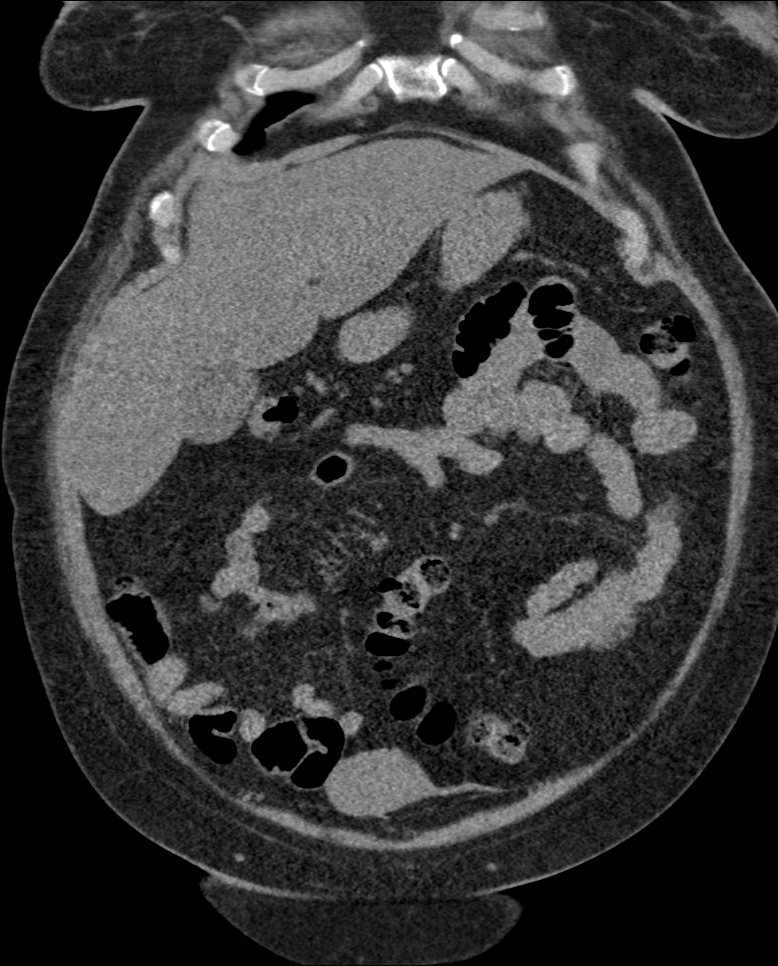
[im 46/103  soft-tissue]
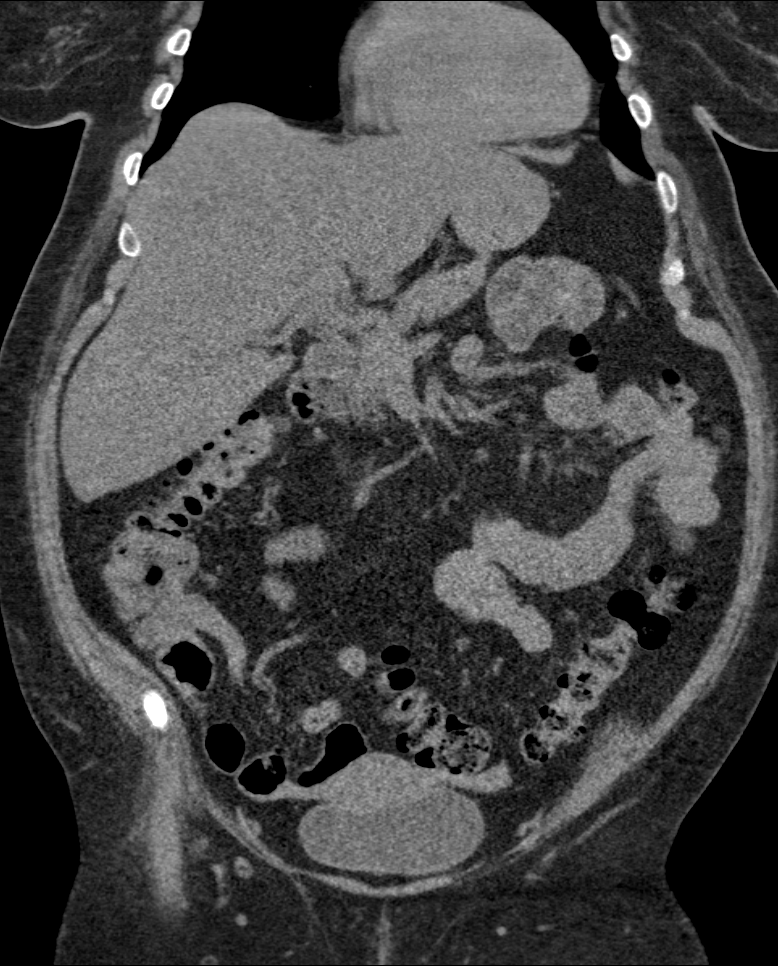
[im 57/103  soft-tissue]
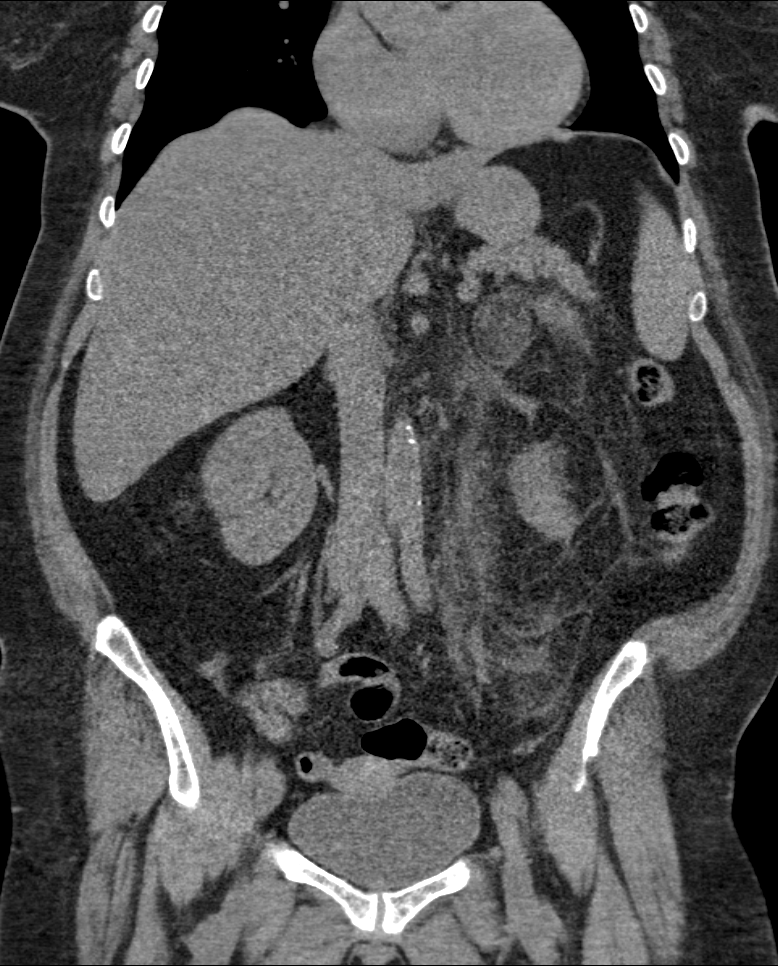

[12 of 46 positions shown; findings below may reference images not displayed]

FINDINGS: There is moderate left hydronephrosis with perinephric stranding
present. Within the renal pelvis, a calculus is present measuring 13
x 8 x 9 mm. There is an additional 6 mm calculus in the distal
ureter located approximately 3 cm proximal to the ureterovesical
junction. No other left-sided renal or ureteral calculi identified.
No evidence of right-sided renal calculi. The bladder is
unremarkable.

Unenhanced appearance of the liver, gallbladder, pancreas and spleen
are unremarkable. There is a low density rounded mass emanating from
the lateral aspect of the left adrenal gland and measuring
approximately 2.9 cm in greatest diameter. This lesion appears to
contain internal fat with Hounsfield density measurement of -27 HU.
Findings are consistent with adrenal adenoma or myelolipoma.

Bowel loops are unremarkable. No free fluid or abscess is
identified. No hernias are seen. No enlarged lymph nodes. Bony
structures are unremarkable.
IMPRESSION: 1. Left hydronephrosis with 6 mm distal ureteral calculus as well as
a calculus in the renal pelvis measuring 13 x 8 x 9 mm.
2. 2.9 cm fat containing rounded lesion emanating from the lateral
left adrenal gland. This is consistent with benign adenoma or
myelolipoma.

## 2018-04-17 ENCOUNTER — Ambulatory Visit: Payer: Self-pay | Attending: Internal Medicine | Admitting: Internal Medicine

## 2018-04-17 ENCOUNTER — Ambulatory Visit: Payer: Self-pay | Attending: Family Medicine | Admitting: Pharmacist

## 2018-04-17 ENCOUNTER — Encounter: Payer: Self-pay | Admitting: Internal Medicine

## 2018-04-17 ENCOUNTER — Other Ambulatory Visit: Payer: Self-pay

## 2018-04-17 ENCOUNTER — Encounter: Payer: Self-pay | Admitting: Pharmacist

## 2018-04-17 VITALS — BP 130/83 | HR 82 | Temp 98.3°F | Resp 16 | Ht 59.0 in | Wt 182.6 lb

## 2018-04-17 DIAGNOSIS — IMO0001 Reserved for inherently not codable concepts without codable children: Secondary | ICD-10-CM

## 2018-04-17 DIAGNOSIS — Z23 Encounter for immunization: Secondary | ICD-10-CM | POA: Insufficient documentation

## 2018-04-17 DIAGNOSIS — F329 Major depressive disorder, single episode, unspecified: Secondary | ICD-10-CM | POA: Insufficient documentation

## 2018-04-17 DIAGNOSIS — L292 Pruritus vulvae: Secondary | ICD-10-CM | POA: Insufficient documentation

## 2018-04-17 DIAGNOSIS — E1165 Type 2 diabetes mellitus with hyperglycemia: Secondary | ICD-10-CM | POA: Insufficient documentation

## 2018-04-17 DIAGNOSIS — Z794 Long term (current) use of insulin: Secondary | ICD-10-CM | POA: Insufficient documentation

## 2018-04-17 DIAGNOSIS — Z1331 Encounter for screening for depression: Secondary | ICD-10-CM

## 2018-04-17 DIAGNOSIS — Z791 Long term (current) use of non-steroidal anti-inflammatories (NSAID): Secondary | ICD-10-CM | POA: Insufficient documentation

## 2018-04-17 DIAGNOSIS — G8918 Other acute postprocedural pain: Secondary | ICD-10-CM | POA: Insufficient documentation

## 2018-04-17 DIAGNOSIS — N898 Other specified noninflammatory disorders of vagina: Secondary | ICD-10-CM

## 2018-04-17 DIAGNOSIS — E119 Type 2 diabetes mellitus without complications: Secondary | ICD-10-CM | POA: Insufficient documentation

## 2018-04-17 DIAGNOSIS — R300 Dysuria: Secondary | ICD-10-CM | POA: Insufficient documentation

## 2018-04-17 DIAGNOSIS — E669 Obesity, unspecified: Secondary | ICD-10-CM | POA: Insufficient documentation

## 2018-04-17 DIAGNOSIS — M1711 Unilateral primary osteoarthritis, right knee: Secondary | ICD-10-CM | POA: Insufficient documentation

## 2018-04-17 DIAGNOSIS — Z87442 Personal history of urinary calculi: Secondary | ICD-10-CM | POA: Insufficient documentation

## 2018-04-17 DIAGNOSIS — M170 Bilateral primary osteoarthritis of knee: Secondary | ICD-10-CM | POA: Insufficient documentation

## 2018-04-17 DIAGNOSIS — Z6835 Body mass index (BMI) 35.0-35.9, adult: Secondary | ICD-10-CM | POA: Insufficient documentation

## 2018-04-17 LAB — POCT URINALYSIS DIP (CLINITEK)
Bilirubin, UA: NEGATIVE
Blood, UA: NEGATIVE
Glucose, UA: NEGATIVE mg/dL
Ketones, POC UA: NEGATIVE mg/dL
NITRITE UA: NEGATIVE
PROTEIN: NEGATIVE
Spec Grav, UA: 1.015 (ref 1.010–1.025)
Urobilinogen, UA: 0.2 E.U./dL
pH, UA: 5 (ref 5.0–8.0)

## 2018-04-17 LAB — POCT GLYCOSYLATED HEMOGLOBIN (HGB A1C): HbA1c, POC (controlled diabetic range): 8.9 % — AB (ref 0.0–7.0)

## 2018-04-17 LAB — GLUCOSE, POCT (MANUAL RESULT ENTRY): POC GLUCOSE: 232 mg/dL — AB (ref 70–99)

## 2018-04-17 MED ORDER — INSULIN GLARGINE 100 UNIT/ML SOLOSTAR PEN: PEN_INJECTOR | SUBCUTANEOUS | 99 refills | Status: DC

## 2018-04-17 MED ORDER — GLIMEPIRIDE 4 MG PO TABS: 4.0000 mg | ORAL_TABLET | Freq: Every day | ORAL | 5 refills | Status: DC

## 2018-04-17 MED ORDER — PEN NEEDLES 31G X 6 MM MISC: 6 refills | Status: AC

## 2018-04-17 MED ORDER — METFORMIN HCL 1000 MG PO TABS: 1000.0000 mg | ORAL_TABLET | Freq: Two times a day (BID) | ORAL | 5 refills | Status: DC

## 2018-04-17 MED ORDER — MELOXICAM 15 MG PO TABS: 15.0000 mg | ORAL_TABLET | Freq: Every day | ORAL | 5 refills | Status: DC

## 2018-04-17 MED FILL — TRUEPLUS PEN NDL 31G X 1/4: 31G X 6 MM | 30 days supply | Qty: 100 | Fill #0

## 2018-04-17 MED FILL — MELOXICAM 15 MG TABLET: 15 | 30 days supply | Qty: 30 | Fill #0

## 2018-04-17 MED FILL — TRUEPLUS PEN NDL 31G X 1/4": 31G X 6 MM | 30 days supply | Qty: 100 | Fill #0

## 2018-04-17 MED FILL — GLIMEPIRIDE 4 MG TABS: 4 | 30 days supply | Qty: 30 | Fill #0

## 2018-04-17 MED FILL — metFORMIN HCL 1000 MG TABS: 1000 | 30 days supply | Qty: 60 | Fill #0

## 2018-04-17 MED FILL — LANTUS SOLOSTAR 100 UNITS/M: 100 | 30 days supply | Qty: 6 | Fill #0

## 2018-04-17 NOTE — Progress Notes (Signed)
Patient ID: Laura Craig, female    DOB: 1956/07/20  MRN: [id number]  CC: New Patient (Initial Visit)   Subjective: Laura Craig is a 62 y.o. female who presents for chronic ds management and new pt visit.  Sahar, her daughter-in-law, is with her and interprets.  Pt from [place].  Her concerns today include:  Pt with hx of kidney stones, HL, DM 2, arthritis   DM:  Hx of DM x 12 yrs; on Metformin and Amaryl.  Was also on Lantus pen in [place] but none sine being in [place] past 2 mths Checks BS 2 x a wk.  Gives range in the 300.  Not doing well with eating habits.  Trying to cut back on sugars and bread.  Drinks water, tea (with sugar), juices and Pepsi. Eats white bread and rice; white and red meat.   Not very active due to pain RT knee -last eye exam was 1 yr ago.  -occasional numbness in hands/feet.    OA knee:  Had knee replacement LT knee 18 mths ago.  Told she needs to have TKR on RT knee also but she is afraid. Reports having severe pain post-op pain in LT knee for 1 yr.  Takes Ibuprofen several days a wk.   C/o dysuria and vaginal itching constantly for 2 yrs.   Positive depression screen: Patient states that this is not a major issue for her and she is not depressed.  Current Outpatient Medications on File Prior to Visit  Medication Sig Dispense Refill  . acetaminophen (TYLENOL) 325 MG tablet Take 650 mg by mouth every 6 (six) hours as needed for fever.    . metFORMIN (GLUCOPHAGE) 500 MG tablet Take 1,000 mg by mouth 2 (two) times daily with a meal.     No current facility-administered medications on file prior to visit.     No Known Allergies  Social History   Socioeconomic History  . Marital status: Married    Spouse name: Not on file  . Number of children: 6  . Years of education: Not on file  . Highest education level: Not on file  Occupational History  . Occupation: Unemployed  Social Needs  . Financial resource strain: Not on file  . Food insecurity:   Worry: Not on file    Inability: Not on file  . Transportation needs:    Medical: Not on file    Non-medical: Not on file  Tobacco Use  . Smoking status: Never Smoker  . Smokeless tobacco: Never Used  Substance and Sexual Activity  . Alcohol use: No  . Drug use: No  . Sexual activity: Not on file  Lifestyle  . Physical activity:    Days per week: Not on file    Minutes per session: Not on file  . Stress: Not on file  Relationships  . Social connections:    Talks on phone: Not on file    Gets together: Not on file    Attends religious service: Not on file    Active member of club or organization: Not on file    Attends meetings of clubs or organizations: Not on file    Relationship status: Not on file  . Intimate partner violence:    Fear of current or ex partner: Not on file    Emotionally abused: Not on file    Physically abused: Not on file    Forced sexual activity: Not on file  Other Topics Concern  . Not  on file  Social History Narrative  . Not on file    History reviewed. No pertinent family history.  Past Surgical History:  Procedure Laterality Date  . CATARACT EXTRACTION W/ INTRAOCULAR LENS  IMPLANT, BILATERAL    . CESAREAN SECTION    . CYSTOSCOPY W/ RETROGRADES Left 05/27/2014   Procedure: CYSTOSCOPY WITH RETROGRADE PYELOGRAM;  Surgeon: [name], MD;  Location: [facility];  Service: Urology;  Laterality: Left;  . CYSTOSCOPY W/ URETERAL STENT PLACEMENT Left 05/03/2014   Procedure: CYSTOSCOPY WITH RETROGRADE PYELOGRAM/URETERAL STENT PLACEMENT;  Surgeon: Crist Fat, MD;  Location: [facility];  Service: Urology;  Laterality: Left;  . CYSTOSCOPY W/ URETERAL STENT PLACEMENT Left 05/27/2014   Procedure: CYSTOSCOPY WITH STENT REPLACEMENT;  Surgeon: [name], MD;  Location: [facility];  Service: Urology;  Laterality: Left;  . CYSTOSCOPY WITH URETEROSCOPY Left 05/27/2014   Procedure: CYSTOSCOPY WITH URETEROSCOPY/  STONE EXTRACTION;  Surgeon: [name], MD;  Location: [facility];  Service: Urology;  Laterality: Left;  . HOLMIUM LASER APPLICATION Left 05/27/2014   Procedure: HOLMIUM LASER APPLICATION;  Surgeon: [name], MD;  Location: [facility];  Service: Urology;  Laterality: Left;  . laser lithotripsy      ROS: Review of Systems  Constitutional: Negative for activity change, appetite change and unexpected weight change.  Eyes: Negative for visual disturbance.  Respiratory: Negative for chest tightness and shortness of breath.   Cardiovascular: Negative for chest pain and palpitations.  Gastrointestinal: Negative for blood in stool, constipation and diarrhea.  Genitourinary: Positive for dysuria.    PHYSICAL EXAM: BP 130/83   Pulse 82   Temp 98.3 F (36.8 C) (Oral)   Resp 16   Ht 4\' 11"  (1.499 m)   Wt 182 lb 9.6 oz (82.8 kg)   SpO2 97%   BMI 36.88 kg/m   Wt Readings from Last 3 Encounters:  04/17/18 182 lb 9.6 oz (82.8 kg)  05/27/14 172 lb (78 kg)  05/03/14 165 lb 4 oz (75 kg)    Physical Exam  General appearance - alert, well appearing, older female and in no distress Mental status - normal mood, behavior, speech, dress, motor activity, and thought processes Eyes - pupils equal and reactive, extraocular eye movements intact Nose - normal and patent, no erythema, discharge or polyps Mouth - mucous membranes moist, pharynx normal without lesions Neck - supple, no significant adenopathy Chest - clear to auscultation, no wheezes, rales or rhonchi, symmetric air entry Heart - normal rate, regular rhythm, normal S1, S2, no murmurs, rubs, clicks or gallops Extremities - peripheral pulses normal, no pedal edema, no clubbing or cyanosis MSK: Right knee: No edema.  Positive joint enlargement.  No point tenderness.  Mild crepitus on passive range of motion. Diabetic Foot Exam - Simple   Simple Foot Form Visual Inspection No deformities,  no ulcerations, no other skin breakdown bilaterally:  Yes Sensation Testing Intact to touch and monofilament testing bilaterally:  Yes Pulse Check Posterior Tibialis and Dorsalis pulse intact bilaterally:  Yes Comments     Depression screen PHQ 2/9 04/17/2018  Decreased Interest 1  Down, Depressed, Hopeless 2  PHQ - 2 Score 3  Altered sleeping 2  Tired, decreased energy 2  Change in appetite 1  Feeling bad or failure about yourself  0  Trouble concentrating 1  Moving slowly or fidgety/restless 0  Suicidal thoughts 0  PHQ-9 Score 9   Results for orders placed or  performed in visit on 04/17/18  POCT glucose (manual entry)  Result Value Ref Range   POC Glucose 232 (A) 70 - 99 mg/dl  POCT glycosylated hemoglobin (Hb A1C)  Result Value Ref Range   Hemoglobin A1C     HbA1c POC (<> result, manual entry)     HbA1c, POC (prediabetic range)     HbA1c, POC (controlled diabetic range) 8.9 (A) 0.0 - 7.0 %  POCT URINALYSIS DIP (CLINITEK)  Result Value Ref Range   Color, UA yellow yellow   Clarity, UA clear clear   Glucose, UA negative negative mg/dL   Bilirubin, UA negative negative   Ketones, POC UA negative negative mg/dL   Spec Grav, UA [id number] - 1.025   Blood, UA negative negative   pH, UA 5.0 5.0 - 8.0   POC Protein UA Negative Negative, Trace   Urobilinogen, UA 0.2 0.2 or 1.0 E.U./dL   Nitrite, UA Negative Negative   Leukocytes, UA Small (1+) (A) Negative    ASSESSMENT AND PLAN:  1. Diabetes mellitus type 2, uncontrolled, without complications (HCC) Discussed the importance of healthy eating habits, regular aerobic exercise (at least 150 minutes a week as tolerated) and medication compliance to achieve or maintain control of diabetes. Dietary counseling given.  Advised to cut out sugary drinks and cut back on white carbohydrates. -Continue metformin and Amaryl.  Add Lantus. Encourage her to get an eye exam done for yearly screening. - POCT glucose (manual entry) -  POCT glycosylated hemoglobin (Hb A1C) - Microalbumin / creatinine urine ratio - CBC - Comprehensive metabolic panel - Lipid panel - Insulin Glargine (LANTUS SOLOSTAR) 100 UNIT/ML Solostar Pen; Start at 10 units Subcut QHS.  Increase in 3 unit increments every 2-3 days until a.m BS consistently less than 130.  Max 20 units.  Dispense: 5 pen; Refill: PRN - Insulin Pen Needle (PEN NEEDLES) 31G X 6 MM MISC; Use as directed  Dispense: 100 each; Refill: 6  2. Primary osteoarthritis of right knee - meloxicam (MOBIC) 15 MG tablet; Take 1 tablet (15 mg total) by mouth daily.  Dispense: 30 tablet; Refill: 5  3. Need for Streptococcus pneumoniae and influenza vaccination Given by CMA  4. Obesity (BMI 35.0-39.9 without comorbidity) See #1 above  5. Dysuria UA negative for UTI - POCT URINALYSIS DIP (CLINITEK)  6. Vaginal itching - Urine cytology ancillary only  7. Need for immunization against influenza - Flu Vaccine QUAD 36+ mos IM  8. Positive depression screening Patient states this is not an issue for her at this time. Patient was given the opportunity to ask questions.  Patient verbalized understanding of the plan and was able to repeat key elements of the plan.   Orders Placed This Encounter  Procedures  . Flu Vaccine QUAD 36+ mos IM  . Microalbumin / creatinine urine ratio  . CBC  . Comprehensive metabolic panel  . Lipid panel  . POCT glucose (manual entry)  . POCT glycosylated hemoglobin (Hb A1C)  . POCT URINALYSIS DIP (CLINITEK)     Requested Prescriptions   Signed Prescriptions Disp Refills  . Insulin Glargine (LANTUS SOLOSTAR) 100 UNIT/ML Solostar Pen 5 pen PRN    Sig: Start at 10 units Subcut QHS.  Increase in 3 unit increments every 2-3 days until a.m BS consistently less than 130.  Max 20 units.  . Insulin Pen Needle (PEN NEEDLES) 31G X 6 MM MISC 100 each 6    Sig: Use as directed  . meloxicam (MOBIC) 15 MG  tablet 30 tablet 5    Sig: Take 1 tablet (15 mg total) by  mouth daily.    Return in about 2 months (around 06/17/2018).  Jonah Blue, MD, FACP

## 2018-04-17 NOTE — Progress Notes (Signed)
Cbg 232 a1c 8.9

## 2018-04-17 NOTE — Progress Notes (Signed)
Patient was educated on the use of the Lantus insulin pen. Reviewed necessary supplies and operation of the pen. Also reviewed goal blood glucose levels. Patient was able to demonstrate use. All questions and concerns were addressed.  Of note, she was not aware that the abdomen is the preferred site for injection. Additionally, pt reports reusing pen needles. Both of these items addressed with the patient and she is amenable to use the pen as demonstrated by myself.   Butch PennyLuke Van Ausdall, PharmD, CPP Clinical Pharmacist [facility] [phone]

## 2018-04-17 NOTE — Patient Instructions (Addendum)
Diabetes Mellitus and Nutrition When you have diabetes (diabetes mellitus), it is very important to have healthy eating habits because your blood sugar (glucose) levels are greatly affected by what you eat and drink. Eating healthy foods in the appropriate amounts, at about the same times every day, can help you:  Control your blood glucose.  Lower your risk of heart disease.  Improve your blood pressure.  Reach or maintain a healthy weight.  Every person with diabetes is different, and each person has different needs for a meal plan. Your health care provider may recommend that you work with a diet and nutrition specialist (dietitian) to make a meal plan that is best for you. Your meal plan may vary depending on factors such as:  The calories you need.  The medicines you take.  Your weight.  Your blood glucose, blood pressure, and cholesterol levels.  Your activity level.  Other health conditions you have, such as heart or kidney disease.  How do carbohydrates affect me? Carbohydrates affect your blood glucose level more than any other type of food. Eating carbohydrates naturally increases the amount of glucose in your blood. Carbohydrate counting is a method for keeping track of how many carbohydrates you eat. Counting carbohydrates is important to keep your blood glucose at a healthy level, especially if you use insulin or take certain oral diabetes medicines. It is important to know how many carbohydrates you can safely have in each meal. This is different for every person. Your dietitian can help you calculate how many carbohydrates you should have at each meal and for snack. Foods that contain carbohydrates include:  Bread, cereal, rice, pasta, and crackers.  Potatoes and corn.  Peas, beans, and lentils.  Milk and yogurt.  Fruit and juice.  Desserts, such as cakes, cookies, ice cream, and candy.  How does alcohol affect me? Alcohol can cause a sudden decrease in blood  glucose (hypoglycemia), especially if you use insulin or take certain oral diabetes medicines. Hypoglycemia can be a life-threatening condition. Symptoms of hypoglycemia (sleepiness, dizziness, and confusion) are similar to symptoms of having too much alcohol. If your health care provider says that alcohol is safe for you, follow these guidelines:  Limit alcohol intake to no more than 1 drink per day for nonpregnant women and 2 drinks per day for men. One drink equals 12 oz of beer, 5 oz of wine, or 1 oz of hard liquor.  Do not drink on an empty stomach.  Keep yourself hydrated with water, diet soda, or unsweetened iced tea.  Keep in mind that regular soda, juice, and other mixers may contain a lot of sugar and must be counted as carbohydrates.  What are tips for following this plan? Reading food labels  Start by checking the serving size on the label. The amount of calories, carbohydrates, fats, and other nutrients listed on the label are based on one serving of the food. Many foods contain more than one serving per package.  Check the total grams (g) of carbohydrates in one serving. You can calculate the number of servings of carbohydrates in one serving by dividing the total carbohydrates by 15. For example, if a food has 30 g of total carbohydrates, it would be equal to 2 servings of carbohydrates.  Check the number of grams (g) of saturated and trans fats in one serving. Choose foods that have low or no amount of these fats.  Check the number of milligrams (mg) of sodium in one serving. Most people   should limit total sodium intake to less than 2,300 mg per day.  Always check the nutrition information of foods labeled as "low-fat" or "nonfat". These foods may be higher in added sugar or refined carbohydrates and should be avoided.  Talk to your dietitian to identify your daily goals for nutrients listed on the label. Shopping  Avoid buying canned, premade, or processed foods. These  foods tend to be high in fat, sodium, and added sugar.  Shop around the outside edge of the grocery store. This includes fresh fruits and vegetables, bulk grains, fresh meats, and fresh dairy. Cooking  Use low-heat cooking methods, such as baking, instead of high-heat cooking methods like deep frying.  Cook using healthy oils, such as olive, canola, or sunflower oil.  Avoid cooking with butter, cream, or high-fat meats. Meal planning  Eat meals and snacks regularly, preferably at the same times every day. Avoid going long periods of time without eating.  Eat foods high in fiber, such as fresh fruits, vegetables, beans, and whole grains. Talk to your dietitian about how many servings of carbohydrates you can eat at each meal.  Eat 4-6 ounces of lean protein each day, such as lean meat, chicken, fish, eggs, or tofu. 1 ounce is equal to 1 ounce of meat, chicken, or fish, 1 egg, or 1/4 cup of tofu.  Eat some foods each day that contain healthy fats, such as avocado, nuts, seeds, and fish. Lifestyle   Check your blood glucose regularly.  Exercise at least 30 minutes 5 or more days each week, or as told by your health care provider.  Take medicines as told by your health care provider.  Do not use any products that contain nicotine or tobacco, such as cigarettes and e-cigarettes. If you need help quitting, ask your health care provider.  Work with a Veterinary surgeon or diabetes educator to identify strategies to manage stress and any emotional and social challenges. What are some questions to ask my health care provider?  Do I need to meet with a diabetes educator?  Do I need to meet with a dietitian?  What number can I call if I have questions?  When are the best times to check my blood glucose? Where to find more information:  American Diabetes Association: diabetes.org/food-and-fitness/food  Academy of Nutrition and Dietetics:  https://www.vargas.com/  [employer] of Diabetes and Digestive and Kidney Diseases (NIH): FindJewelers.cz Summary  A healthy meal plan will help you control your blood glucose and maintain a healthy lifestyle.  Working with a diet and nutrition specialist (dietitian) can help you make a meal plan that is best for you.  Keep in mind that carbohydrates and alcohol have immediate effects on your blood glucose levels. It is important to count carbohydrates and to use alcohol carefully. This information is not intended to replace advice given to you by your health care provider. Make sure you discuss any questions you have with your health care provider. Document Released: 05/06/2005 Document Revised: 09/13/2016 Document Reviewed: 09/13/2016 Elsevier Interactive Patient Education  2018 Elsevier Inc.   Pneumococcal Polysaccharide Vaccine: What You Need to Know 1. Why get vaccinated? Vaccination can protect older adults (and some children and younger adults) from pneumococcal disease. Pneumococcal disease is caused by bacteria that can spread from person to person through close contact. It can cause ear infections, and it can also lead to more serious infections of the:  Lungs (pneumonia),  Blood (bacteremia), and  Covering of the brain and spinal cord (meningitis). Meningitis  can cause deafness and brain damage, and it can be fatal.  Anyone can get pneumococcal disease, but children under 74 years of age, people with certain medical conditions, adults over 24 years of age, and cigarette smokers are at the highest risk. About 18,000 older adults die each year from pneumococcal disease in the [place]. Treatment of pneumococcal infections with penicillin and other drugs used to be more effective. But some strains of the disease have become resistant to these drugs. This  makes prevention of the disease, through vaccination, even more important. 2. Pneumococcal polysaccharide vaccine (PPSV23) Pneumococcal polysaccharide vaccine (PPSV23) protects against 23 types of pneumococcal bacteria. It will not prevent all pneumococcal disease. PPSV23 is recommended for:  All adults 54 years of age and older,  Anyone 2 through 62 years of age with certain long-term health problems,  Anyone 2 through 62 years of age with a weakened immune system,  Adults 35 through 62 years of age who smoke cigarettes or have asthma.  Most people need only one dose of PPSV. A second dose is recommended for certain high-risk groups. People 22 and older should get a dose even if they have gotten one or more doses of the vaccine before they turned 65. Your healthcare provider can give you more information about these recommendations. Most healthy adults develop protection within 2 to 3 weeks of getting the shot. 3. Some people should not get this vaccine  Anyone who has had a life-threatening allergic reaction to PPSV should not get another dose.  Anyone who has a severe allergy to any component of PPSV should not receive it. Tell your provider if you have any severe allergies.  Anyone who is moderately or severely ill when the shot is scheduled may be asked to wait until they recover before getting the vaccine. Someone with a mild illness can usually be vaccinated.  Children less than 46 years of age should not receive this vaccine.  There is no evidence that PPSV is harmful to either a pregnant woman or to her fetus. However, as a precaution, women who need the vaccine should be vaccinated before becoming pregnant, if possible. 4. Risks of a vaccine reaction With any medicine, including vaccines, there is a chance of side effects. These are usually mild and go away on their own, but serious reactions are also possible. About half of people who get PPSV have mild side effects, such as  redness or pain where the shot is given, which go away within about two days. Less than 1 out of 100 people develop a fever, muscle aches, or more severe local reactions. Problems that could happen after any vaccine:  People sometimes faint after a medical procedure, including vaccination. Sitting or lying down for about 15 minutes can help prevent fainting, and injuries caused by a fall. Tell your doctor if you feel dizzy, or have vision changes or ringing in the ears.  Some people get severe pain in the shoulder and have difficulty moving the arm where a shot was given. This happens very rarely.  Any medication can cause a severe allergic reaction. Such reactions from a vaccine are very rare, estimated at about 1 in a million doses, and would happen within a few minutes to a few hours after the vaccination. As with any medicine, there is a very remote chance of a vaccine causing a serious injury or death. The safety of vaccines is always being monitored. For more information, visit: http://floyd.org/ 5. What if there is  a serious reaction? What should I look for? Look for anything that concerns you, such as signs of a severe allergic reaction, very high fever, or unusual behavior. Signs of a severe allergic reaction can include hives, swelling of the face and throat, difficulty breathing, a fast heartbeat, dizziness, and weakness. These would usually start a few minutes to a few hours after the vaccination. What should I do? If you think it is a severe allergic reaction or other emergency that can't wait, call 9-1-1 or get to the nearest hospital. Otherwise, call your doctor. Afterward, the reaction should be reported to the Vaccine Adverse Event Reporting System (VAERS). Your doctor might file this report, or you can do it yourself through the VAERS web site at www.vaers.LAgents.nohhs.gov, or by calling 1-980-431-6953. VAERS does not give medical advice. 6. How can I learn more?  Ask your  doctor. He or she can give you the vaccine package insert or suggest other sources of information.  Call your local or state health department.  Contact the Centers for Disease Control and Prevention (CDC): ? Call 747-879-71931-7578081491 (1-800-CDC-INFO) or ? Visit CDC's website at PicCapture.uywww.cdc.gov/vaccines CDC Pneumococcal Polysaccharide Vaccine VIS (12/14/13) This information is not intended to replace advice given to you by your health care provider. Make sure you discuss any questions you have with your health care provider. Document Released: 06/06/2006 Document Revised: 04/29/2016 Document Reviewed: 04/29/2016 Elsevier Interactive Patient Education  2017 Elsevier Inc.   Influenza Virus Vaccine injection (Fluarix) What is this medicine? INFLUENZA VIRUS VACCINE (in floo EN zuh VAHY ruhs vak SEEN) helps to reduce the risk of getting influenza also known as the flu. This medicine may be used for other purposes; ask your health care provider or pharmacist if you have questions. COMMON BRAND NAME(S): Fluarix, Fluzone What should I tell my health care provider before I take this medicine? They need to know if you have any of these conditions: -bleeding disorder like hemophilia -fever or infection -Guillain-Barre syndrome or other neurological problems -immune system problems -infection with the human immunodeficiency virus (HIV) or AIDS -low blood platelet counts -multiple sclerosis -an unusual or allergic reaction to influenza virus vaccine, eggs, chicken proteins, latex, gentamicin, other medicines, foods, dyes or preservatives -pregnant or trying to get pregnant -breast-feeding How should I use this medicine? This vaccine is for injection into a muscle. It is given by a health care professional. A copy of Vaccine Information Statements will be given before each vaccination. Read this sheet carefully each time. The sheet may change frequently. Talk to your pediatrician regarding the use of this  medicine in children. Special care may be needed. Overdosage: If you think you have taken too much of this medicine contact a poison control center or emergency room at once. NOTE: This medicine is only for you. Do not share this medicine with others. What if I miss a dose? This does not apply. What may interact with this medicine? -chemotherapy or radiation therapy -medicines that lower your immune system like etanercept, anakinra, infliximab, and adalimumab -medicines that treat or prevent blood clots like warfarin -phenytoin -steroid medicines like prednisone or cortisone -theophylline -vaccines This list may not describe all possible interactions. Give your health care provider a list of all the medicines, herbs, non-prescription drugs, or dietary supplements you use. Also tell them if you smoke, drink alcohol, or use illegal drugs. Some items may interact with your medicine. What should I watch for while using this medicine? Report any side effects that do not go  away within 3 days to your doctor or health care professional. Call your health care provider if any unusual symptoms occur within 6 weeks of receiving this vaccine. You may still catch the flu, but the illness is not usually as bad. You cannot get the flu from the vaccine. The vaccine will not protect against colds or other illnesses that may cause fever. The vaccine is needed every year. What side effects may I notice from receiving this medicine? Side effects that you should report to your doctor or health care professional as soon as possible: -allergic reactions like skin rash, itching or hives, swelling of the face, lips, or tongue Side effects that usually do not require medical attention (report to your doctor or health care professional if they continue or are bothersome): -fever -headache -muscle aches and pains -pain, tenderness, redness, or swelling at site where injected -weak or tired This list may not describe  all possible side effects. Call your doctor for medical advice about side effects. You may report side effects to FDA at 1-800-FDA-1088. Where should I keep my medicine? This vaccine is only given in a clinic, pharmacy, doctor's office, or other health care setting and will not be stored at home. NOTE: This sheet is a summary. It may not cover all possible information. If you have questions about this medicine, talk to your doctor, pharmacist, or health care provider.  2018 Elsevier/Gold Standard (2008-03-06 09:30:40)

## 2018-04-18 LAB — LIPID PANEL
Chol/HDL Ratio: 6.5 ratio — ABNORMAL HIGH (ref 0.0–4.4)
Cholesterol, Total: 287 mg/dL — ABNORMAL HIGH (ref 100–199)
HDL: 44 mg/dL (ref >39)
LDL Calculated: 192 mg/dL — ABNORMAL HIGH (ref 0–99)
Triglycerides: 253 mg/dL — ABNORMAL HIGH (ref 0–149)
VLDL CHOLESTEROL CAL: 51 mg/dL — AB (ref 5–40)

## 2018-04-18 LAB — COMPREHENSIVE METABOLIC PANEL
ALBUMIN: 4.5 g/dL (ref 3.6–4.8)
ALT: 19 IU/L (ref 0–32)
AST: 15 IU/L (ref 0–40)
Albumin/Globulin Ratio: 1.7 (ref 1.2–2.2)
Alkaline Phosphatase: 100 IU/L (ref 39–117)
BILIRUBIN TOTAL: 0.4 mg/dL (ref 0.0–1.2)
BUN/Creatinine Ratio: 21 (ref 12–28)
BUN: 13 mg/dL (ref 8–27)
CO2: 24 mmol/L (ref 20–29)
Calcium: 9.5 mg/dL (ref 8.7–10.3)
Chloride: 100 mmol/L (ref 96–106)
Creatinine, Ser: 0.61 mg/dL (ref 0.57–1.00)
GFR calc Af Amer: 112 mL/min/{1.73_m2} (ref >59)
GFR calc non Af Amer: 97 mL/min/{1.73_m2} (ref >59)
GLUCOSE: 238 mg/dL — AB (ref 65–99)
Globulin, Total: 2.7 g/dL (ref 1.5–4.5)
Potassium: 4.7 mmol/L (ref 3.5–5.2)
Sodium: 140 mmol/L (ref 134–144)
Total Protein: 7.2 g/dL (ref 6.0–8.5)

## 2018-04-18 LAB — CBC
HEMOGLOBIN: 12.1 g/dL (ref 11.1–15.9)
Hematocrit: 36 % (ref 34.0–46.6)
MCH: 28.8 pg (ref 26.6–33.0)
MCHC: 33.6 g/dL (ref 31.5–35.7)
MCV: 86 fL (ref 79–97)
PLATELETS: 355 10*3/uL (ref 150–450)
RBC: 4.2 x10E6/uL (ref 3.77–5.28)
RDW: 13.7 % (ref 12.3–15.4)
WBC: 5.4 10*3/uL (ref 3.4–10.8)

## 2018-04-18 LAB — MICROALBUMIN / CREATININE URINE RATIO
CREATININE, UR: 73.5 mg/dL
MICROALBUM., U, RANDOM: 26.7 ug/mL
Microalb/Creat Ratio: 36.3 mg/g creat — ABNORMAL HIGH (ref 0.0–30.0)

## 2018-04-19 ENCOUNTER — Telehealth: Payer: Self-pay

## 2018-04-19 ENCOUNTER — Other Ambulatory Visit: Payer: Self-pay | Admitting: Internal Medicine

## 2018-04-19 LAB — URINE CYTOLOGY ANCILLARY ONLY: CANDIDA VAGINITIS: NEGATIVE

## 2018-04-19 MED ORDER — ATORVASTATIN CALCIUM 20 MG PO TABS: 20.0000 mg | ORAL_TABLET | Freq: Every day | ORAL | 3 refills | Status: DC

## 2018-04-19 MED FILL — ATORVASTATIN 20 MG TABLET: 20 | 90 days supply | Qty: 90 | Fill #0

## 2018-04-19 NOTE — Telephone Encounter (Signed)
Contacted pt daughter to go over lab results pt daughter didn't answer lvm asking for her to give me a call at her earliest convenience   If pt daughter calls back please give results: blood count,kidney and LFts are nl. LDL cholesterol is 192 with goal being less than 70 in DM. Recommend starting med called Lipitor to help lower cholesterol and risk for heart attack and stroke. Rxn sent to pharmacy.

## 2018-04-20 ENCOUNTER — Telehealth: Payer: Self-pay | Admitting: Internal Medicine

## 2018-04-25 ENCOUNTER — Ambulatory Visit: Payer: Self-pay | Attending: Internal Medicine

## 2018-05-16 MED FILL — metFORMIN HCL 1000 MG TABS: 1000 | 30 days supply | Qty: 60 | Fill #1

## 2018-05-17 ENCOUNTER — Ambulatory Visit: Payer: Self-pay | Attending: Internal Medicine | Admitting: Family Medicine

## 2018-05-17 VITALS — BP 137/79 | HR 83 | Temp 98.1°F | Resp 17 | Ht 59.0 in | Wt 187.4 lb

## 2018-05-17 DIAGNOSIS — Z789 Other specified health status: Secondary | ICD-10-CM

## 2018-05-17 DIAGNOSIS — E669 Obesity, unspecified: Secondary | ICD-10-CM | POA: Insufficient documentation

## 2018-05-17 DIAGNOSIS — Z794 Long term (current) use of insulin: Secondary | ICD-10-CM | POA: Insufficient documentation

## 2018-05-17 DIAGNOSIS — E1165 Type 2 diabetes mellitus with hyperglycemia: Secondary | ICD-10-CM | POA: Insufficient documentation

## 2018-05-17 DIAGNOSIS — M1711 Unilateral primary osteoarthritis, right knee: Secondary | ICD-10-CM | POA: Insufficient documentation

## 2018-05-17 DIAGNOSIS — Z6837 Body mass index (BMI) 37.0-37.9, adult: Secondary | ICD-10-CM | POA: Insufficient documentation

## 2018-05-17 DIAGNOSIS — Z79899 Other long term (current) drug therapy: Secondary | ICD-10-CM | POA: Insufficient documentation

## 2018-05-17 MED ORDER — GABAPENTIN 100 MG PO CAPS: 100.0000 mg | ORAL_CAPSULE | Freq: Three times a day (TID) | ORAL | 3 refills | Status: DC

## 2018-05-17 MED ORDER — METFORMIN HCL 1000 MG PO TABS: 1000.0000 mg | ORAL_TABLET | Freq: Two times a day (BID) | ORAL | 5 refills | Status: DC

## 2018-05-17 MED ORDER — ATORVASTATIN CALCIUM 20 MG PO TABS: 20.0000 mg | ORAL_TABLET | Freq: Every day | ORAL | 3 refills | Status: DC

## 2018-05-17 MED ORDER — MELOXICAM 15 MG PO TABS: 15.0000 mg | ORAL_TABLET | Freq: Every day | ORAL | 5 refills | Status: DC

## 2018-05-17 MED ORDER — GLIMEPIRIDE 4 MG PO TABS: 4.0000 mg | ORAL_TABLET | Freq: Every day | ORAL | 5 refills | Status: DC

## 2018-05-17 NOTE — Patient Instructions (Signed)
8   4: 30      .   [address] [place]            .       Marland Kitchen        Gabapentin.  100           .         Marland Kitchen            Marland Kitchen        .                  . tabie almutabaeat mae alduktur junsun. 'adhhab 'iilaa qism musaa kawn lil'ashieat , bayn alssaeat 8 sbahana w 4: 30 msa'an lilhusul ealaa taswir lilrakbat alyusraa. aleunwan hu [address] [place] sawf takun ealaa aitisal fima yataealaq bijadwalat maweid mae jirahat aleuzami. laqad tama 'iihalatuk 'iilaa biudmunt lijarahat aleazam. dawa' jadid yudaf alyawm hu Gabapentin. tanawul 100 malgh hsb alhajat hataa thlath marrat fi alyawm li'alam fi alrakbati. aldawa' ealaa ma yaram litakhudh mae miluksikam 'aw taylinul. qad yatasabab aljababinatayn fi alnueas wabialttali tukhi alhidhr mae altamshi 'aw alqiadati. kunt takhudh miluksikam lialam fi alrakbat. la takhudh mdadat alailtihab ghyr alsitirawyiydiat al'ukhraa mithl al'asbarin 'athna' tanawul almilukusikam li'an hdha sayazid min khatar alnazif.    Keep follow-up with Dr. Laural Benes.   Go to [facility] Radiology department, between the hours 8 am-4:30pm to obtain imaging of left knee. The address is [address] [place]   You will be contact regarding scheduling an appointment with orthopedics. You have been referred to [facility].   New medication added today is Gabapentin. Take 100 mg as needed up too three times per day for knee pain. Medication is okay to take with Meloxicam or Tylenol.  Gabapentin may cause drowsiness therefore caution with ambulation or driving.   You are taking Meloxicam for knee pain. Do not take other NSAIDS such as aspirin while taking Meloxicam as this will increase your risk of bleeding.     Knee Pain, Adult Many things can cause knee pain. The pain often goes away on its own with time and rest. If the pain does not go away, tests may be done to find out what is causing the pain. Follow these instructions at home: Activity  Rest your knee.  Do not do things that cause pain.  Avoid activities where both feet leave the ground at the same time (high-impact activities). Examples are running, jumping rope, and doing jumping jacks. General instructions  Take medicines only as told by your doctor.  Raise (elevate) your knee when you are resting. Make sure your knee is higher than your heart.  Sleep with a pillow under your knee.  If told, put ice on the knee: ? Put ice in a plastic bag. ? Place a towel between your skin and the bag. ? Leave the ice on for 20 minutes, 2-3 times a day.  Ask your doctor if you should wear an elastic knee support.  Lose weight if you are overweight. Being overweight can make your knee hurt more.  Do not use any tobacco products. These include cigarettes, chewing tobacco, or electronic cigarettes. If you need help quitting, ask your doctor. Smoking may slow down healing. Contact a doctor if:  The pain does not stop.  The pain changes or gets worse.  You have a fever along with knee pain.  Your knee  gives out or locks up.  Your knee swells, and becomes worse. Get help right away if:  Your knee feels warm.  You cannot move your knee.  You have very bad knee pain.  You have chest pain.  You have trouble breathing. Summary  Many things can cause knee pain. The pain often goes away on its own with time and rest.  Avoid activities that put stress on your knee. These include running and jumping  rope.  Get help right away if you cannot move your knee, or if your knee feels warm, or if you have trouble breathing. This information is not intended to replace advice given to you by your health care provider. Make sure you discuss any questions you have with your health care provider. Document Released: 11/05/2008 Document Revised: 08/03/2016 Document Reviewed: 08/03/2016 Elsevier Interactive Patient Education  2017 [employer].

## 2018-05-17 NOTE — Progress Notes (Signed)
Patient ID: Laura Craig, female    DOB: 02-Sep-1955, 62 y.o.   MRN: [id number]  PCP: Marcine Matar, MD  Chief Complaint  Patient presents with  . Knee Pain    R knee pain x 8 months. describes the pain as a stinging sharp pain that occassionally radiates down her leg. pain is constant. has swelling sometimes. feels off balance & has fallen    Subjective:   Knee Pain: Patient presents with knee pain involving the  right knee. Onset of the symptoms was about a month ago. She is status post, left knee replacement which she received in [place] almost 2 years previously. Inciting event: any form of activity. Although worsens with ambulation. Current symptoms include giving out, locking, pain located medial lateral ligament  and stiffness. Pain is aggravated by any weight bearing, going up and down stairs, inactivity, lateral movements and rising after sitting.  Patient has had prior knee problems. Evaluation to date: none. Treatment to date: rest and daily Meloxicam which initially improved pain, although current is not as effective. Social History   Socioeconomic History  . Marital status: Married    Spouse name: Not on file  . Number of children: 6  . Years of education: Not on file  . Highest education level: Not on file  Occupational History  . Occupation: Unemployed  Social Needs  . Financial resource strain: Not on file  . Food insecurity:    Worry: Not on file    Inability: Not on file  . Transportation needs:    Medical: Not on file    Non-medical: Not on file  Tobacco Use  . Smoking status: Never Smoker  . Smokeless tobacco: Never Used  Substance and Sexual Activity  . Alcohol use: No  . Drug use: No  . Sexual activity: Not on file  Lifestyle  . Physical activity:    Days per week: Not on file    Minutes per session: Not on file  . Stress: Not on file  Relationships  . Social connections:    Talks on phone: Not on file    Gets together: Not on file    Attends  religious service: Not on file    Active member of club or organization: Not on file    Attends meetings of clubs or organizations: Not on file    Relationship status: Not on file  . Intimate partner violence:    Fear of current or ex partner: Not on file    Emotionally abused: Not on file    Physically abused: Not on file    Forced sexual activity: Not on file  Other Topics Concern  . Not on file  Social History Narrative  . Not on file    No family history on file.  Review of Systems Pertinent negatives listed in HPI  Patient Active Problem List   Diagnosis Date Noted  . Diabetes mellitus type 2, uncontrolled, without complications (HCC) 04/17/2018  . Primary osteoarthritis of right knee 04/17/2018  . Obesity (BMI 35.0-39.9 without comorbidity) 04/17/2018    No Known Allergies  Prior to Admission medications   Medication Sig Start Date End Date Taking? Authorizing Provider  acetaminophen (TYLENOL) 325 MG tablet Take 650 mg by mouth every 6 (six) hours as needed for fever.   Yes [provider]  atorvastatin (LIPITOR) 20 MG tablet Take 1 tablet (20 mg total) by mouth daily. 04/19/18  Yes Marcine Matar, MD  glimepiride (AMARYL) 4 MG tablet Take 1  tablet (4 mg total) by mouth daily with breakfast. 04/17/18  Yes Marcine MatarJohnson, Deborah B, MD  Insulin Glargine (LANTUS SOLOSTAR) 100 UNIT/ML Solostar Pen Start at 10 units Subcut QHS.  Increase in 3 unit increments every 2-3 days until a.m BS consistently less than 130.  Max 20 units. 04/17/18  Yes Marcine MatarJohnson, Deborah B, MD  Insulin Pen Needle (PEN NEEDLES) 31G X 6 MM MISC Use as directed 04/17/18  Yes Marcine MatarJohnson, Deborah B, MD  meloxicam (MOBIC) 15 MG tablet Take 1 tablet (15 mg total) by mouth daily. 04/17/18  Yes Marcine MatarJohnson, Deborah B, MD  metFORMIN (GLUCOPHAGE) 1000 MG tablet Take 1 tablet (1,000 mg total) by mouth 2 (two) times daily with a meal. 04/17/18  Yes Marcine MatarJohnson, Deborah B, MD    Past Medical, Surgical Family and Social History  reviewed and updated.    Objective:   Today's Vitals   05/17/18 1555  BP: 137/79  Pulse: 83  Resp: 17  Temp: 98.1 F (36.7 C)  TempSrc: Oral  SpO2: 97%  Weight: 187 lb 6.4 oz (85 kg)  Height: 4\' 11"  (1.499 m)  PainSc: 10-Worst pain ever  PainLoc: Knee    Wt Readings from Last 3 Encounters:  05/17/18 187 lb 6.4 oz (85 kg)  04/17/18 182 lb 9.6 oz (82.8 kg)  05/27/14 172 lb (78 kg)    Physical Exam  Constitutional: She appears well-developed and well-nourished.  Cardiovascular: Normal rate.  Pulmonary/Chest: Effort normal.  Musculoskeletal: She exhibits tenderness.       Right knee: She exhibits decreased range of motion and swelling. She exhibits no effusion. Tenderness found. Medial joint line and patellar tendon tenderness noted.  Skin: Skin is warm and dry.   Assessment & Plan:  1. Primary osteoarthritis of right knee, patient has been hesitant in the past to  Undergo evaluation for possible right knee replacement. However she not have 100% [place] financial assistance and therefore is ready to be referred to orthopedics. Will obtain a complete view of the right knee image today so that recent imaging is readily available. Continue Mobic. Adding Gabapentin 100 mg TiD as needed for pain.   2. Language Barrier, family interpreting during visit. Patient understands some English although difficulty communicating in [place].  Meds ordered this encounter  Medications  . metFORMIN (GLUCOPHAGE) 1000 MG tablet    Sig: Take 1 tablet (1,000 mg total) by mouth 2 (two) times daily with a meal.    Dispense:  120 tablet    Refill:  5  . meloxicam (MOBIC) 15 MG tablet    Sig: Take 1 tablet (15 mg total) by mouth daily.    Dispense:  30 tablet    Refill:  5  . glimepiride (AMARYL) 4 MG tablet    Sig: Take 1 tablet (4 mg total) by mouth daily with breakfast.    Dispense:  30 tablet    Refill:  5  . atorvastatin (LIPITOR) 20 MG tablet    Sig: Take 1 tablet (20 mg total) by  mouth daily.    Dispense:  90 tablet    Refill:  3  . gabapentin (NEURONTIN) 100 MG capsule    Sig: Take 1 capsule (100 mg total) by mouth 3 (three) times daily.    Dispense:  90 capsule    Refill:  3   Orders Placed This Encounter  Procedures  . DG Knee Complete 4 Views Right    Standing Status:   Future    Standing Expiration Date:  06/16/2018    Order Specific Question:   Reason for Exam (SYMPTOM  OR DIAGNOSIS REQUIRED)    Answer:   right knee pain and recent falls    Order Specific Question:   Preferred imaging location?    Answer:   [facility]    Order Specific Question:   Radiology Contrast Protocol - do NOT remove file path    Answer:   [email or web address]  . Ambulatory referral to Orthopedic Surgery    Referral Priority:   Routine    Referral Type:   Surgical    Referral Reason:   Specialty Services Required    Requested Specialty:   Orthopedic Surgery    Number of Visits Requested:   3   .  Keep scheduled follow-up with PCP.  Refilled chronic medications per patient request.   -The patient was given clear instructions to go to ER or return to medical center if symptoms do not improve, worsen or new problems develop. The patient verbalized understanding.  A total of 25  minutes spent, greater than 50 % of this time was spent counseling and coordination of care.   Godfrey Pick. Tiburcio Pea, MSN, [facility]  [address] [place], [place], [place] [address] [phone]

## 2018-05-18 MED FILL — MELOXICAM 15 MG TABLET: 15 | 30 days supply | Qty: 30 | Fill #0

## 2018-05-18 MED FILL — GABAPENTIN 100 MG CAPSULE: 100 | 30 days supply | Qty: 90 | Fill #0

## 2018-05-18 MED FILL — ?GLIMEPIRIDE 4 MG TABLET: 4 | 30 days supply | Qty: 30 | Fill #0

## 2018-05-22 ENCOUNTER — Ambulatory Visit (INDEPENDENT_AMBULATORY_CARE_PROVIDER_SITE_OTHER): Payer: Self-pay

## 2018-05-22 ENCOUNTER — Encounter (INDEPENDENT_AMBULATORY_CARE_PROVIDER_SITE_OTHER): Payer: Self-pay | Admitting: Physician Assistant

## 2018-05-22 ENCOUNTER — Ambulatory Visit (INDEPENDENT_AMBULATORY_CARE_PROVIDER_SITE_OTHER): Payer: Self-pay | Admitting: Physician Assistant

## 2018-05-22 DIAGNOSIS — M1711 Unilateral primary osteoarthritis, right knee: Secondary | ICD-10-CM

## 2018-05-22 NOTE — Progress Notes (Signed)
Office Visit Note   Patient: Laura Craig           Date of Birth: 1955/12/01           MRN: [id number] Visit Date: 05/22/2018              Requested by: Bing Neighbors, FNP [address] [place], [place] [address] PCP: Marcine Matar, MD   Assessment & Plan: Visit Diagnoses:  1. Primary osteoarthritis of right knee     Plan: Discussed with patient through interpreter was present today that due to her poor diabetes control and her hemoglobin A1c being 8.9 would not recommend surgery at this time.  Suggest that she work on strengthening of her right knee.  Also use a cane and a knee brace to hopefully keep her from having a fall that results in a fracture.  If she stays in the country and her hemoglobin A1c is below 8 we could safely proceed with surgery.  Questions encouraged and answered with patient and her daughters present throughout the examination using an interpreter today.  Follow-Up Instructions: Return in about 3 months (around 08/21/2018).   Orders:  Orders Placed This Encounter  Procedures  . XR KNEE 3 VIEW RIGHT   No orders of the defined types were placed in this encounter.     Procedures: No procedures performed   Clinical Data: No additional findings.   Subjective: Chief Complaint  Patient presents with  . Right Knee - Pain    HPI Laura Craig is a 61 year old female from [place] whose had right knee pain for at least the past 10 years.  She had a knee replacement in [place] 18 months ago on the left and is doing well.  She still has some discomfort and swelling in the knee.  Right knee causes her to fall at times.  She has constant pain in the knee to help some with ibuprofen.  She had an injection in her right knee back in 2011 which initially helped and a repeat injection 2018 gave her no relief.  She presents today with her daughter and interpreter.  She is diabetic and poorly controlled with last hemoglobin A1c in August being 8.9 Review of  Systems She denies any dizziness, loss of consciousness, fevers or chills.  Objective: Vital Signs: There were no vitals taken for this visit.  Physical Exam  Constitutional: She appears well-developed and well-nourished. No distress.  Neurological: She is alert.  Skin: She is not diaphoretic.  Psychiatric: Her behavior is normal.    Ortho Exam Left knee good range of motion well-healed surgical incision.  Right knee full extension slight varus deformity.  Tenderness along medial joint line no instability valgus varus stressing.  No abnormal warmth erythema ecchymosis or effusion of the right knee.  Right calf supple nontender Specialty Comments:  No specialty comments available.  Imaging: Xr Knee 3 View Right  Result Date: 05/22/2018 Right knee AP lateral and sunrise view: Tricompartmental changes right knee with bone-on-bone medial compartment and severe patellofemoral changes.  No acute fracture.  Status post left total knee components appear well-seated.    PMFS History: Patient Active Problem List   Diagnosis Date Noted  . Diabetes mellitus type 2, uncontrolled, without complications (HCC) 04/17/2018  . Primary osteoarthritis of right knee 04/17/2018  . Obesity (BMI 35.0-39.9 without comorbidity) 04/17/2018   Past Medical History:  Diagnosis Date  . Arthritis   . History of kidney stones   . Hyperlipidemia   .  Left ureteral calculus   . Nephrolithiasis   . Type 2 diabetes mellitus (HCC)     No family history on file.  Past Surgical History:  Procedure Laterality Date  . CATARACT EXTRACTION W/ INTRAOCULAR LENS  IMPLANT, BILATERAL    . CESAREAN SECTION    . CYSTOSCOPY W/ RETROGRADES Left 05/27/2014   Procedure: CYSTOSCOPY WITH RETROGRADE PYELOGRAM;  Surgeon: [name], MD;  Location: [facility];  Service: Urology;  Laterality: Left;  . CYSTOSCOPY W/ URETERAL STENT PLACEMENT Left 05/03/2014   Procedure: CYSTOSCOPY WITH RETROGRADE  PYELOGRAM/URETERAL STENT PLACEMENT;  Surgeon: Crist Fat, MD;  Location: [facility];  Service: Urology;  Laterality: Left;  . CYSTOSCOPY W/ URETERAL STENT PLACEMENT Left 05/27/2014   Procedure: CYSTOSCOPY WITH STENT REPLACEMENT;  Surgeon: [name], MD;  Location: [facility];  Service: Urology;  Laterality: Left;  . CYSTOSCOPY WITH URETEROSCOPY Left 05/27/2014   Procedure: CYSTOSCOPY WITH URETEROSCOPY/ STONE EXTRACTION;  Surgeon: [name], MD;  Location: [facility];  Service: Urology;  Laterality: Left;  . HOLMIUM LASER APPLICATION Left 05/27/2014   Procedure: HOLMIUM LASER APPLICATION;  Surgeon: [name], MD;  Location: [facility];  Service: Urology;  Laterality: Left;  . laser lithotripsy     Social History   Occupational History  . Occupation: Unemployed  Tobacco Use  . Smoking status: Never Smoker  . Smokeless tobacco: Never Used  Substance and Sexual Activity  . Alcohol use: No  . Drug use: No  . Sexual activity: Not on file

## 2018-05-29 MED FILL — LANTUS SOLOSTAR 100 UNITS/M: 100 | 30 days supply | Qty: 6 | Fill #1

## 2018-06-08 MED FILL — metFORMIN HCL 1000 MG TABS: 1000 | 30 days supply | Qty: 60 | Fill #2

## 2018-06-19 ENCOUNTER — Encounter: Payer: Self-pay | Admitting: Internal Medicine

## 2018-06-19 ENCOUNTER — Ambulatory Visit: Payer: Self-pay | Attending: Internal Medicine | Admitting: Internal Medicine

## 2018-06-19 VITALS — BP 133/79 | HR 89 | Temp 98.9°F | Resp 16 | Wt 182.2 lb

## 2018-06-19 DIAGNOSIS — Z79899 Other long term (current) drug therapy: Secondary | ICD-10-CM | POA: Insufficient documentation

## 2018-06-19 DIAGNOSIS — IMO0002 Reserved for concepts with insufficient information to code with codable children: Secondary | ICD-10-CM

## 2018-06-19 DIAGNOSIS — E1165 Type 2 diabetes mellitus with hyperglycemia: Secondary | ICD-10-CM

## 2018-06-19 DIAGNOSIS — R300 Dysuria: Secondary | ICD-10-CM

## 2018-06-19 DIAGNOSIS — E669 Obesity, unspecified: Secondary | ICD-10-CM | POA: Insufficient documentation

## 2018-06-19 DIAGNOSIS — Z87442 Personal history of urinary calculi: Secondary | ICD-10-CM | POA: Insufficient documentation

## 2018-06-19 DIAGNOSIS — E785 Hyperlipidemia, unspecified: Secondary | ICD-10-CM

## 2018-06-19 DIAGNOSIS — Z794 Long term (current) use of insulin: Secondary | ICD-10-CM | POA: Insufficient documentation

## 2018-06-19 DIAGNOSIS — E1142 Type 2 diabetes mellitus with diabetic polyneuropathy: Secondary | ICD-10-CM

## 2018-06-19 DIAGNOSIS — M1711 Unilateral primary osteoarthritis, right knee: Secondary | ICD-10-CM

## 2018-06-19 DIAGNOSIS — Z791 Long term (current) use of non-steroidal anti-inflammatories (NSAID): Secondary | ICD-10-CM | POA: Insufficient documentation

## 2018-06-19 LAB — POCT URINALYSIS DIP (CLINITEK)
Bilirubin, UA: NEGATIVE
Blood, UA: NEGATIVE
Glucose, UA: 100 mg/dL — AB
LEUKOCYTES UA: NEGATIVE
Nitrite, UA: NEGATIVE
PH UA: 5 (ref 5.0–8.0)
Spec Grav, UA: 1.015 (ref 1.010–1.025)
Urobilinogen, UA: 0.2 E.U./dL

## 2018-06-19 LAB — GLUCOSE, POCT (MANUAL RESULT ENTRY): POC GLUCOSE: 272 mg/dL — AB (ref 70–99)

## 2018-06-19 MED ORDER — TETANUS-DIPHTH-ACELL PERTUSSIS 5-2.5-18.5 LF-MCG/0.5 IM SUSP: 0.5000 mL | Freq: Once | INTRAMUSCULAR | 0 refills | Status: AC

## 2018-06-19 MED ORDER — INSULIN GLARGINE 100 UNIT/ML SOLOSTAR PEN: PEN_INJECTOR | SUBCUTANEOUS | 99 refills | Status: DC

## 2018-06-19 MED ORDER — METFORMIN HCL 1000 MG PO TABS: 1000.0000 mg | ORAL_TABLET | Freq: Two times a day (BID) | ORAL | 0 refills | Status: DC

## 2018-06-19 MED ORDER — ATORVASTATIN CALCIUM 20 MG PO TABS: 20.0000 mg | ORAL_TABLET | Freq: Every day | ORAL | 0 refills | Status: DC

## 2018-06-19 MED ORDER — MELOXICAM 15 MG PO TABS: 15.0000 mg | ORAL_TABLET | Freq: Every day | ORAL | 0 refills | Status: DC

## 2018-06-19 MED ORDER — GABAPENTIN 100 MG PO CAPS: 100.0000 mg | ORAL_CAPSULE | Freq: Three times a day (TID) | ORAL | 0 refills | Status: DC

## 2018-06-19 MED ORDER — GLIMEPIRIDE 4 MG PO TABS: 4.0000 mg | ORAL_TABLET | Freq: Every day | ORAL | 0 refills | Status: DC

## 2018-06-19 MED FILL — LANTUS SOLOSTAR 100 UNITS/M: 100 | 25 days supply | Qty: 6 | Fill #0

## 2018-06-19 MED FILL — ATORVASTATIN 20 MG TABLET: 20 | 120 days supply | Qty: 120 | Fill #0

## 2018-06-19 MED FILL — GABAPENTIN 100 MG CAPSULE: 100 | 120 days supply | Qty: 360 | Fill #0

## 2018-06-19 MED FILL — metFORMIN HCL 1000 MG TABS: 1000 | 120 days supply | Qty: 240 | Fill #0

## 2018-06-19 MED FILL — GLIMEPIRIDE 4 MG TABLET: 4 | 120 days supply | Qty: 120 | Fill #0

## 2018-06-19 MED FILL — MELOXICAM 15 MG TABLET: 15 | 120 days supply | Qty: 120 | Fill #0

## 2018-06-19 NOTE — Progress Notes (Signed)
Patient ID: Laura Craig, female    DOB: 1956/07/18  MRN: [id number]  CC: Diabetes   Subjective: Laura Craig is a 62 y.o. female who presents for 60-month follow-up.  Her son, Kandace Parkins, is with her.  Patient originally from [place].  Last seen 2 mths ago as new pt Her concerns today include:  Pt with hx of kidney stones, HL, DM 2 with peripheral neuropathy, OA RT knee, LT TKR, obesity   DM:  Checks BS in a.m before breakfast.  Gives range 160-180.  No lows. Reports compliance with Metformin, Amaryl and Lantus.  Currently on Lantus 20 units daily. She endorses numbness in the toes.  She is on gabapentin.  OA:  Still has significant pain in RT knee.  Saw orthopedics last mth.  X-rays revealed tri-compartment OA.  Surgery not recommended at this time due to DM not under control.   She will be going back to [place] in 9 days for 4-5 mths.  She has not had any falls.  HL: she did receive lab results and is taking and tolerating the Lipitor.   Request 4 mths supply on meds to take with her to [place].   Patient Active Problem List   Diagnosis Date Noted  . Diabetes mellitus type 2, uncontrolled, without complications (HCC) 04/17/2018  . Primary osteoarthritis of right knee 04/17/2018  . Obesity (BMI 35.0-39.9 without comorbidity) 04/17/2018     Current Outpatient Medications on File Prior to Visit  Medication Sig Dispense Refill  . acetaminophen (TYLENOL) 325 MG tablet Take 650 mg by mouth every 6 (six) hours as needed for fever.    . Insulin Pen Needle (PEN NEEDLES) 31G X 6 MM MISC Use as directed 100 each 6   No current facility-administered medications on file prior to visit.     No Known Allergies  Social History   Socioeconomic History  . Marital status: Married    Spouse name: Not on file  . Number of children: 6  . Years of education: Not on file  . Highest education level: Not on file  Occupational History  . Occupation: Unemployed  Social Needs  . Financial resource  strain: Not on file  . Food insecurity:    Worry: Not on file    Inability: Not on file  . Transportation needs:    Medical: Not on file    Non-medical: Not on file  Tobacco Use  . Smoking status: Never Smoker  . Smokeless tobacco: Never Used  Substance and Sexual Activity  . Alcohol use: No  . Drug use: No  . Sexual activity: Not on file  Lifestyle  . Physical activity:    Days per week: Not on file    Minutes per session: Not on file  . Stress: Not on file  Relationships  . Social connections:    Talks on phone: Not on file    Gets together: Not on file    Attends religious service: Not on file    Active member of club or organization: Not on file    Attends meetings of clubs or organizations: Not on file    Relationship status: Not on file  . Intimate partner violence:    Fear of current or ex partner: Not on file    Emotionally abused: Not on file    Physically abused: Not on file    Forced sexual activity: Not on file  Other Topics Concern  . Not on file  Social History Narrative  .  Not on file    No family history on file.  Past Surgical History:  Procedure Laterality Date  . CATARACT EXTRACTION W/ INTRAOCULAR LENS  IMPLANT, BILATERAL    . CESAREAN SECTION    . CYSTOSCOPY W/ RETROGRADES Left 05/27/2014   Procedure: CYSTOSCOPY WITH RETROGRADE PYELOGRAM;  Surgeon: [name], MD;  Location: [facility];  Service: Urology;  Laterality: Left;  . CYSTOSCOPY W/ URETERAL STENT PLACEMENT Left 05/03/2014   Procedure: CYSTOSCOPY WITH RETROGRADE PYELOGRAM/URETERAL STENT PLACEMENT;  Surgeon: Crist Fat, MD;  Location: [facility];  Service: Urology;  Laterality: Left;  . CYSTOSCOPY W/ URETERAL STENT PLACEMENT Left 05/27/2014   Procedure: CYSTOSCOPY WITH STENT REPLACEMENT;  Surgeon: [name], MD;  Location: [facility];  Service: Urology;  Laterality: Left;  . CYSTOSCOPY WITH URETEROSCOPY Left 05/27/2014   Procedure:  CYSTOSCOPY WITH URETEROSCOPY/ STONE EXTRACTION;  Surgeon: [name], MD;  Location: [facility];  Service: Urology;  Laterality: Left;  . HOLMIUM LASER APPLICATION Left 05/27/2014   Procedure: HOLMIUM LASER APPLICATION;  Surgeon: [name], MD;  Location: [facility];  Service: Urology;  Laterality: Left;  . JOINT REPLACEMENT     LT Knee 2017  . laser lithotripsy      ROS: Review of Systems GU: After visit with completed, patient reportedly told by nurse that she was having some urinary frequency and felt she had a UTI.  My nurse did a urine dip PHYSICAL EXAM: BP 133/79   Pulse 89   Temp 98.9 F (37.2 C) (Oral)   Resp 16   Wt 182 lb 3.2 oz (82.6 kg)   SpO2 96%   BMI 36.80 kg/m   Wt Readings from Last 3 Encounters:  06/19/18 182 lb 3.2 oz (82.6 kg)  05/17/18 187 lb 6.4 oz (85 kg)  04/17/18 182 lb 9.6 oz (82.8 kg)   Physical Exam  General appearance - alert, well appearing, and in no distress Mental status - normal mood, behavior, speech, dress, motor activity, and thought processes Chest - clear to auscultation, no wheezes, rales or rhonchi, symmetric air entry Heart - normal rate, regular rhythm, normal S1, S2, no murmurs, rubs, clicks or gallops Extremities - peripheral pulses normal, no pedal edema, no clubbing or cyanosis Diabetic Foot Exam - Simple   Simple Foot Form Visual Inspection See comments:  Yes Sensation Testing See comments:  Yes Pulse Check Posterior Tibialis and Dorsalis pulse intact bilaterally:  Yes Comments High arches.  Decreased sensation to touch on the plantar surface of the toes with monofilament     Results for orders placed or performed in visit on 06/19/18  POCT glucose (manual entry)  Result Value Ref Range   POC Glucose 272 (A) 70 - 99 mg/dl  POCT URINALYSIS DIP (CLINITEK)  Result Value Ref Range   Color, UA yellow yellow   Clarity, UA cloudy (A) clear   Glucose, UA =100 (A) negative  mg/dL   Bilirubin, UA negative negative   Ketones, POC UA trace (5) (A) negative mg/dL   Spec Grav, UA 3.016 0.109 - 1.025   Blood, UA negative negative   pH, UA 5.0 5.0 - 8.0   POC Protein UA Trace Negative, Trace   Urobilinogen, UA 0.2 0.2 or 1.0 E.U./dL   Nitrite, UA Negative Negative   Leukocytes, UA Negative Negative   Lab Results  Component Value Date   HGBA1C 8.9 (A) 04/17/2018   Lab Results  Component Value Date  CHOL 287 (H) 04/17/2018   HDL 44 04/17/2018   LDLCALC 192 (H) 04/17/2018   TRIG 253 (H) 04/17/2018   CHOLHDL 6.5 (H) 04/17/2018     Chemistry      Component Value Date/Time   NA 140 04/17/2018 1045   K 4.7 04/17/2018 1045   CL 100 04/17/2018 1045   CO2 24 04/17/2018 1045   BUN 13 04/17/2018 1045   CREATININE 0.61 04/17/2018 1045      Component Value Date/Time   CALCIUM 9.5 04/17/2018 1045   ALKPHOS 100 04/17/2018 1045   AST 15 04/17/2018 1045   ALT 19 04/17/2018 1045   BILITOT 0.4 04/17/2018 1045        ASSESSMENT AND PLAN: 1. Uncontrolled type 2 diabetes mellitus with peripheral neuropathy (HCC) Increase Lantus to 24 units daily.  Advised to continue monitoring blood sugars.  Goal is for morning blood sugars to be 90-130 - POCT glucose (manual entry) - metFORMIN (GLUCOPHAGE) 1000 MG tablet; Take 1 tablet (1,000 mg total) by mouth 2 (two) times daily with a meal.  Dispense: 240 tablet; Refill: 0 - gabapentin (NEURONTIN) 100 MG capsule; Take 1 capsule (100 mg total) by mouth 3 (three) times daily.  Dispense: 360 capsule; Refill: 0 - glimepiride (AMARYL) 4 MG tablet; Take 1 tablet (4 mg total) by mouth daily with breakfast.  Dispense: 120 tablet; Refill: 0 - Insulin Glargine (LANTUS SOLOSTAR) 100 UNIT/ML Solostar Pen; Inj 24 units subcut daily  Dispense: 10 pen; Refill: PRN  2. Primary osteoarthritis of right knee Weight loss encouraged.  Once diabetes under better control we can refer back to orthopedics - meloxicam (MOBIC) 15 MG tablet; Take 1  tablet (15 mg total) by mouth daily.  Dispense: 120 tablet; Refill: 0  3. Hyperlipidemia, unspecified hyperlipidemia type - atorvastatin (LIPITOR) 20 MG tablet; Take 1 tablet (20 mg total) by mouth daily.  Dispense: 120 tablet; Refill: 0  4. Dysuria Urine dip not consistent with UTI - POCT URINALYSIS DIP (CLINITEK)  Health maintenance: We will try to get her up-to-date with other age-appropriate screenings when she returns from [place].  Patient was given the opportunity to ask questions.  Patient verbalized understanding of the plan and was able to repeat key elements of the plan.  [employer] interpreter used during this encounter. # [id number]  Orders Placed This Encounter  Procedures  . POCT glucose (manual entry)  . POCT URINALYSIS DIP (CLINITEK)     Requested Prescriptions   Signed Prescriptions Disp Refills  . metFORMIN (GLUCOPHAGE) 1000 MG tablet 240 tablet 0    Sig: Take 1 tablet (1,000 mg total) by mouth 2 (two) times daily with a meal.  . gabapentin (NEURONTIN) 100 MG capsule 360 capsule 0    Sig: Take 1 capsule (100 mg total) by mouth 3 (three) times daily.  . meloxicam (MOBIC) 15 MG tablet 120 tablet 0    Sig: Take 1 tablet (15 mg total) by mouth daily.  Marland Kitchen atorvastatin (LIPITOR) 20 MG tablet 120 tablet 0    Sig: Take 1 tablet (20 mg total) by mouth daily.  Marland Kitchen glimepiride (AMARYL) 4 MG tablet 120 tablet 0    Sig: Take 1 tablet (4 mg total) by mouth daily with breakfast.  . Insulin Glargine (LANTUS SOLOSTAR) 100 UNIT/ML Solostar Pen 10 pen PRN    Sig: Inj 24 units subcut daily  . Tdap (BOOSTRIX) 5-2.5-18.5 LF-MCG/0.5 injection 0.5 mL 0    Sig: Inject 0.5 mLs into the muscle once for 1 dose.  Return if symptoms worsen or fail to improve.  Karle Plumber, MD, FACP

## 2018-06-19 NOTE — Patient Instructions (Signed)
Td Vaccine (Tetanus and Diphtheria): What You Need to Know 1. Why get vaccinated? Tetanus  and diphtheria are very serious diseases. They are rare in the [place] today, but people who do become infected often have severe complications. Td vaccine is used to protect adolescents and adults from both of these diseases. Both tetanus and diphtheria are infections caused by bacteria. Diphtheria spreads from person to person through coughing or sneezing. Tetanus-causing bacteria enter the body through cuts, scratches, or wounds. TETANUS (lockjaw) causes painful muscle tightening and stiffness, usually all over the body.  It can lead to tightening of muscles in the head and neck so you can't open your mouth, swallow, or sometimes even breathe. Tetanus kills about 1 out of every 10 people who are infected even after receiving the best medical care.  DIPHTHERIA can cause a thick coating to form in the back of the throat.  It can lead to breathing problems, paralysis, heart failure, and death.  Before vaccines, as many as 200,000 cases of diphtheria and hundreds of cases of tetanus were reported in the [place] each year. Since vaccination began, reports of cases for both diseases have dropped by about 99%. 2. Td vaccine Td vaccine can protect adolescents and adults from tetanus and diphtheria. Td is usually given as a booster dose every 10 years but it can also be given earlier after a severe and dirty wound or burn. Another vaccine, called Tdap, which protects against pertussis in addition to tetanus and diphtheria, is sometimes recommended instead of Td vaccine. Your doctor or the person giving you the vaccine can give you more information. Td may safely be given at the same time as other vaccines. 3. Some people should not get this vaccine  A person who has ever had a life-threatening allergic reaction after a previous dose of any tetanus or diphtheria containing vaccine, OR has a severe  allergy to any part of this vaccine, should not get Td vaccine. Tell the person giving the vaccine about any severe allergies.  Talk to your doctor if you: ? had severe pain or swelling after any vaccine containing diphtheria or tetanus, ? ever had a condition called Guillain Barre Syndrome (GBS), ? aren't feeling well on the day the shot is scheduled. 4. What are the risks from Td vaccine? With any medicine, including vaccines, there is a chance of side effects. These are usually mild and go away on their own. Serious reactions are also possible but are rare. Most people who get Td vaccine do not have any problems with it. Mild problems following Td vaccine: (Did not interfere with activities)  Pain where the shot was given (about 8 people in 10)  Redness or swelling where the shot was given (about 1 person in 4)  Mild fever (rare)  Headache (about 1 person in 4)  Tiredness (about 1 person in 4)  Moderate problems following Td vaccine: (Interfered with activities, but did not require medical attention)  Fever over 102F (rare)  Severe problems following Td vaccine: (Unable to perform usual activities; required medical attention)  Swelling, severe pain, bleeding and/or redness in the arm where the shot was given (rare).  Problems that could happen after any vaccine:  People sometimes faint after a medical procedure, including vaccination. Sitting or lying down for about 15 minutes can help prevent fainting, and injuries caused by a fall. Tell your doctor if you feel dizzy, or have vision changes or ringing in the ears.  Some people get   severe pain in the shoulder and have difficulty moving the arm where a shot was given. This happens very rarely.  Any medication can cause a severe allergic reaction. Such reactions from a vaccine are very rare, estimated at fewer than 1 in a million doses, and would happen within a few minutes to a few hours after the vaccination. As with any  medicine, there is a very remote chance of a vaccine causing a serious injury or death. The safety of vaccines is always being monitored. For more information, visit: www.cdc.gov/vaccinesafety/ 5. What if there is a serious reaction? What should I look for? Look for anything that concerns you, such as signs of a severe allergic reaction, very high fever, or unusual behavior. Signs of a severe allergic reaction can include hives, swelling of the face and throat, difficulty breathing, a fast heartbeat, dizziness, and weakness. These would usually start a few minutes to a few hours after the vaccination. What should I do?  If you think it is a severe allergic reaction or other emergency that can't wait, call 9-1-1 or get the person to the nearest hospital. Otherwise, call your doctor.  Afterward, the reaction should be reported to the Vaccine Adverse Event Reporting System (VAERS). Your doctor might file this report, or you can do it yourself through the VAERS web site at www.vaers.hhs.gov, or by calling 1-800-822-7967. ? VAERS does not give medical advice. 6. The National Vaccine Injury Compensation Program The National Vaccine Injury Compensation Program (VICP) is a federal program that was created to compensate people who may have been injured by certain vaccines. Persons who believe they may have been injured by a vaccine can learn about the program and about filing a claim by calling 1-800-338-2382 or visiting the VICP website at www.hrsa.gov/vaccinecompensation. There is a time limit to file a claim for compensation. 7. How can I learn more?  Ask your doctor. He or she can give you the vaccine package insert or suggest other sources of information.  Call your local or state health department.  Contact the Centers for Disease Control and Prevention (CDC): ? Call 1-800-232-4636 (1-800-CDC-INFO) ? Visit CDC's website at www.cdc.gov/vaccines CDC Td Vaccine VIS (12/02/15) This information is  not intended to replace advice given to you by your health care provider. Make sure you discuss any questions you have with your health care provider. Document Released: 06/06/2006 Document Revised: 04/29/2016 Document Reviewed: 04/29/2016 Elsevier Interactive Patient Education  2017 Elsevier Inc.  

## 2018-08-21 ENCOUNTER — Ambulatory Visit (INDEPENDENT_AMBULATORY_CARE_PROVIDER_SITE_OTHER): Payer: Self-pay | Admitting: Physician Assistant

## 2018-12-02 NOTE — Telephone Encounter (Signed)
error 

## 2024-06-09 ENCOUNTER — Ambulatory Visit: Payer: Self-pay | Admitting: Internal Medicine

## 2024-06-09 ENCOUNTER — Encounter: Payer: Self-pay | Admitting: Internal Medicine

## 2024-06-09 ENCOUNTER — Other Ambulatory Visit: Payer: Self-pay

## 2024-06-09 VITALS — BP 155/82 | HR 94 | Temp 97.0°F | Ht 59.0 in | Wt 189.2 lb

## 2024-06-09 DIAGNOSIS — I10 Essential (primary) hypertension: Secondary | ICD-10-CM

## 2024-06-09 DIAGNOSIS — E782 Mixed hyperlipidemia: Secondary | ICD-10-CM

## 2024-06-09 DIAGNOSIS — M778 Other enthesopathies, not elsewhere classified: Secondary | ICD-10-CM

## 2024-06-09 DIAGNOSIS — E1165 Type 2 diabetes mellitus with hyperglycemia: Secondary | ICD-10-CM

## 2024-06-09 DIAGNOSIS — M25561 Pain in right knee: Secondary | ICD-10-CM

## 2024-06-09 DIAGNOSIS — Z7689 Persons encountering health services in other specified circumstances: Secondary | ICD-10-CM

## 2024-06-09 DIAGNOSIS — L710 Perioral dermatitis: Secondary | ICD-10-CM

## 2024-06-09 MED ORDER — DOXYCYCLINE HYCLATE 100 MG PO TABS: ORAL_TABLET | ORAL | 0 refills | Status: AC

## 2024-06-09 MED ORDER — EZETIMIBE 10 MG PO TABS: 10.0000 mg | ORAL_TABLET | Freq: Every day | ORAL | 3 refills | Status: AC

## 2024-06-09 MED ORDER — LOSARTAN POTASSIUM 25 MG PO TABS: 25.0000 mg | ORAL_TABLET | Freq: Every day | ORAL | 1 refills | Status: AC

## 2024-06-09 NOTE — Progress Notes (Unsigned)
 Internal MEDICINE  Office Visit Note  Patient Name: Laura Craig  [id number]  [id number]  Date of Service: 06/09/2024  Chief Complaint  Patient presents with  . Diabetes  . Knee Pain  . Wrist Injury  . cheek pain    Feels her cheeks get hot at night    HPI  Pt is coming in with multiple complaints today    Current Medication: Outpatient Encounter Medications as of 06/09/2024  Medication Sig  . doxycycline (VIBRA-TABS) 100 MG tablet Take half tab po every day for rash  . ezetimibe (ZETIA) 10 MG tablet Take 1 tablet (10 mg total) by mouth daily.  SABRA losartan (COZAAR) 25 MG tablet Take 1 tablet (25 mg total) by mouth daily.  . acetaminophen  (TYLENOL ) 325 MG tablet Take 650 mg by mouth every 6 (six) hours as needed for fever.  . Insulin  Pen Needle (PEN NEEDLES) 31G X 6 MM MISC Use as directed  . metFORMIN  (GLUCOPHAGE ) 1000 MG tablet Take 1 tablet (1,000 mg total) by mouth 2 (two) times daily with a meal.  . [DISCONTINUED] atorvastatin  (LIPITOR) 20 MG tablet Take 1 tablet (20 mg total) by mouth daily.  . [DISCONTINUED] gabapentin  (NEURONTIN ) 100 MG capsule Take 1 capsule (100 mg total) by mouth 3 (three) times daily.  . [DISCONTINUED] glimepiride  (AMARYL ) 4 MG tablet Take 1 tablet (4 mg total) by mouth daily with breakfast.  . [DISCONTINUED] Insulin  Glargine (LANTUS  SOLOSTAR) 100 UNIT/ML Solostar Pen Inj 24 units subcut daily  . [DISCONTINUED] meloxicam  (MOBIC ) 15 MG tablet Take 1 tablet (15 mg total) by mouth daily.   No facility-administered encounter medications on file as of 06/09/2024.    Surgical History: Past Surgical History:  Procedure Laterality Date  . CATARACT EXTRACTION W/ INTRAOCULAR LENS  IMPLANT, BILATERAL    . CESAREAN SECTION    . CYSTOSCOPY W/ RETROGRADES Left 05/27/2014   Procedure: CYSTOSCOPY WITH RETROGRADE PYELOGRAM;  Surgeon: Laura CHRISTELLA Shack, MD;  Location[facility];  Service: Urology;  Laterality: Left;  . CYSTOSCOPY W/ URETERAL  STENT PLACEMENT Left 05/03/2014   Procedure: CYSTOSCOPY WITH RETROGRADE PYELOGRAM/URETERAL STENT PLACEMENT;  Surgeon: Laura LELON Salines, MD;  Location: [facility];  Service: Urology;  Laterality: Left;  . CYSTOSCOPY W/ URETERAL STENT PLACEMENT Left 05/27/2014   Procedure: CYSTOSCOPY WITH STENT REPLACEMENT;  Surgeon: Laura CHRISTELLA Shack, MD;  Location: [facility];  Service: Urology;  Laterality: Left;  . CYSTOSCOPY WITH URETEROSCOPY Left 05/27/2014   Procedure: CYSTOSCOPY WITH URETEROSCOPY/ STONE EXTRACTION;  Surgeon: Laura CHRISTELLA Shack, MD;  Location: [facility];  Service: Urology;  Laterality: Left;  . HOLMIUM LASER APPLICATION Left 05/27/2014   Procedure: HOLMIUM LASER APPLICATION;  Surgeon: Laura CHRISTELLA Shack, MD;  Location: [facility];  Service: Urology;  Laterality: Left;  . JOINT REPLACEMENT     LT Knee 2017  . laser lithotripsy      Medical History: Past Medical History:  Diagnosis Date  . Arthritis   . History of kidney stones   . Hyperlipidemia   . Left ureteral calculus   . Nephrolithiasis   . Type 2 diabetes mellitus (HCC)     Family History: Family History  Problem Relation Age of Onset  . Diabetes Mother     Social History   Socioeconomic History  . Marital status: Married    Spouse name: Not on file  . Number of children: 6  . Years of education: Not on file  . Highest education level: Not  on file  Occupational History  . Occupation: Unemployed  Tobacco Use  . Smoking status: Never  . Smokeless tobacco: Never  Vaping Use  . Vaping status: Never Used  Substance and Sexual Activity  . Alcohol use: No  . Drug use: No  . Sexual activity: Not on file  Other Topics Concern  . Not on file  Social History Narrative  . Not on file   Social Drivers of Health   Financial Resource Strain: Not on file  Food Insecurity: Not on file  Transportation Needs: Not on file  Physical Activity: Not on file  Stress: Not on  file  Social Connections: Not on file  Intimate Partner Violence: Not on file      Review of Systems  Constitutional:  Negative for fatigue and fever.  HENT:  Negative for congestion, mouth sores and postnasal drip.   Respiratory:  Negative for cough.   Cardiovascular:  Negative for chest pain.  Genitourinary:  Negative for flank pain.  Psychiatric/Behavioral: Negative.      Vital Signs: BP (!) 155/82 (BP Location: Right Arm, Patient Position: Sitting, Cuff Size: Normal)   Pulse 94   Temp (!) 97 F (36.1 C) (Temporal)   Ht 4' 11 (1.499 m)   Wt 189 lb 3.2 oz (85.8 kg)   SpO2 97%   BMI 38.21 kg/m    Physical Exam Constitutional:      Appearance: Normal appearance.  HENT:     Head: Normocephalic and atraumatic.     Nose: Nose normal.     Mouth/Throat:     Mouth: Mucous membranes are moist.     Pharynx: No posterior oropharyngeal erythema.  Eyes:     Extraocular Movements: Extraocular movements intact.     Pupils: Pupils are equal, round, and reactive to light.  Cardiovascular:     Pulses: Normal pulses.     Heart sounds: Normal heart sounds.  Pulmonary:     Effort: Pulmonary effort is normal.     Breath sounds: Normal breath sounds.  Neurological:     General: No focal deficit present.     Mental Status: She is alert.  Psychiatric:        Mood and Affect: Mood normal.        Behavior: Behavior normal.        Assessment/Plan: 1. Encounter to establish care (Primary) All labs are updated   2. Type 2 diabetes mellitus with hyperglycemia, with long-term current use of insulin  (HCC) Toujeo  40 units at night Novorapid 30 units with 3 meals   3. Essential hypertension, benign Will start Cozar   4. Moderate mixed hyperlipidemia not requiring statin therapy Zetia is added in her chart, she has medicine at home   5. Arthralgia of knee, right - Ambulatory referral to Orthopedic Surgery  6. Left wrist tendonitis - Ambulatory referral to Orthopedic  Surgery  7. Perioral dermatitis - doxycycline (VIBRA-TABS) 100 MG tablet; Take half tab po every day for rash  Dispense: 20 tablet; Refill: 0   General Counseling: Laura Craig verbalizes understanding of the findings of todays visit and agrees with plan of treatment. I have discussed any further diagnostic evaluation that may be needed or ordered today. We also reviewed her medications today. she has been encouraged to call the office with any questions or concerns that should arise related to todays visit.    Orders Placed This Encounter  Procedures  . Ambulatory referral to Orthopedic Surgery    Meds ordered this encounter  Medications  .  ezetimibe (ZETIA) 10 MG tablet    Sig: Take 1 tablet (10 mg total) by mouth daily.    Dispense:  90 tablet    Refill:  3  . losartan (COZAAR) 25 MG tablet    Sig: Take 1 tablet (25 mg total) by mouth daily.    Dispense:  90 tablet    Refill:  1  . doxycycline (VIBRA-TABS) 100 MG tablet    Sig: Take half tab po every day for rash    Dispense:  20 tablet    Refill:  0    Total time spent:15 Minutes Time spent includes review of chart, medications, test results, and follow up plan with the patient.   [place] Controlled Substance Database was reviewed by me.   Dr Josefa Syracuse M Jerett Odonohue Internal medicine

## 2024-06-11 ENCOUNTER — Other Ambulatory Visit: Payer: Self-pay | Admitting: Internal Medicine

## 2024-06-12 LAB — LIPID PANEL
Chol/HDL Ratio: 7.5 ratio — ABNORMAL HIGH (ref 0.0–4.4)
Cholesterol, Total: 329 mg/dL — ABNORMAL HIGH (ref 100–199)
HDL: 44 mg/dL (ref >39)
LDL Chol Calc (NIH): 226 mg/dL — ABNORMAL HIGH (ref 0–99)
Triglycerides: 285 mg/dL — ABNORMAL HIGH (ref 0–149)
VLDL Cholesterol Cal: 59 mg/dL — ABNORMAL HIGH (ref 5–40)

## 2024-06-12 LAB — ANA: Anti Nuclear Antibody (ANA): NEGATIVE

## 2024-06-12 LAB — CBC WITH DIFFERENTIAL/PLATELET
Basophils Absolute: 0.1 x10E3/uL (ref 0.0–0.2)
Basos: 1 %
EOS (ABSOLUTE): 0.2 x10E3/uL (ref 0.0–0.4)
Eos: 2 %
Hematocrit: 37 % (ref 34.0–46.6)
Hemoglobin: 12.1 g/dL (ref 11.1–15.9)
Immature Grans (Abs): 0 x10E3/uL (ref 0.0–0.1)
Immature Granulocytes: 0 %
Lymphocytes Absolute: 2.1 x10E3/uL (ref 0.7–3.1)
Lymphs: 31 %
MCH: 29.1 pg (ref 26.6–33.0)
MCHC: 32.7 g/dL (ref 31.5–35.7)
MCV: 89 fL (ref 79–97)
Monocytes Absolute: 0.4 x10E3/uL (ref 0.1–0.9)
Monocytes: 7 %
Neutrophils Absolute: 4 x10E3/uL (ref 1.4–7.0)
Neutrophils: 59 %
Platelets: 318 x10E3/uL (ref 150–450)
RBC: 4.16 x10E6/uL (ref 3.77–5.28)
RDW: 13.1 % (ref 11.7–15.4)
WBC: 6.7 x10E3/uL (ref 3.4–10.8)

## 2024-06-12 LAB — COMPREHENSIVE METABOLIC PANEL WITH GFR
ALT: 31 IU/L (ref 0–32)
AST: 25 IU/L (ref 0–40)
Albumin: 4.3 g/dL (ref 3.9–4.9)
Alkaline Phosphatase: 150 IU/L — ABNORMAL HIGH (ref 49–135)
BUN/Creatinine Ratio: 32 — ABNORMAL HIGH (ref 12–28)
BUN: 20 mg/dL (ref 8–27)
Bilirubin Total: 0.4 mg/dL (ref 0.0–1.2)
CO2: 24 mmol/L (ref 20–29)
Calcium: 9.8 mg/dL (ref 8.7–10.3)
Chloride: 100 mmol/L (ref 96–106)
Creatinine, Ser: 0.63 mg/dL (ref 0.57–1.00)
Globulin, Total: 2.4 g/dL (ref 1.5–4.5)
Glucose: 252 mg/dL — ABNORMAL HIGH (ref 70–99)
Potassium: 4.6 mmol/L (ref 3.5–5.2)
Sodium: 138 mmol/L (ref 134–144)
Total Protein: 6.7 g/dL (ref 6.0–8.5)
eGFR: 97 mL/min/1.73 (ref >59)

## 2024-06-12 LAB — TSH: TSH: 3.06 u[IU]/mL (ref 0.450–4.500)

## 2024-06-12 LAB — T4, FREE: Free T4: 0.93 ng/dL (ref 0.82–1.77)

## 2024-06-12 LAB — SEDIMENTATION RATE: Sed Rate: 60 mm/h — ABNORMAL HIGH (ref 0–40)

## 2024-06-12 LAB — HGB A1C W/O EAG: Hgb A1c MFr Bld: 10.1 % — ABNORMAL HIGH (ref 4.8–5.6)

## 2024-06-30 ENCOUNTER — Ambulatory Visit: Payer: Self-pay | Admitting: Gastroenterology

## 2024-06-30 ENCOUNTER — Encounter: Payer: Self-pay | Admitting: Internal Medicine

## 2024-06-30 ENCOUNTER — Ambulatory Visit: Payer: Self-pay | Admitting: Student in an Organized Health Care Education/Training Program

## 2024-06-30 ENCOUNTER — Encounter: Payer: Self-pay | Admitting: Gastroenterology

## 2024-06-30 ENCOUNTER — Ambulatory Visit: Payer: Self-pay | Admitting: Internal Medicine

## 2024-06-30 VITALS — BP 129/77 | HR 101 | Temp 97.0°F | Ht 62.0 in | Wt 183.2 lb

## 2024-06-30 DIAGNOSIS — K5904 Chronic idiopathic constipation: Secondary | ICD-10-CM | POA: Insufficient documentation

## 2024-06-30 MED ORDER — ATORVASTATIN CALCIUM 10 MG PO TABS
10.0000 mg | ORAL_TABLET | Freq: Every day | ORAL | 3 refills | Status: AC
Start: 2024-06-30 — End: ?

## 2024-06-30 MED ORDER — PSYLLIUM 58.6 % PO PACK: 1.0000 | PACK | Freq: Two times a day (BID) | ORAL | 2 refills | Status: AC

## 2024-06-30 MED ORDER — TOUJEO MAX SOLOSTAR 300 UNIT/ML ~~LOC~~ SOPN: PEN_INJECTOR | SUBCUTANEOUS | 3 refills | Status: AC

## 2024-06-30 MED ORDER — POLYETHYLENE GLYCOL 3350 17 G PO PACK: 17.0000 g | PACK | Freq: Two times a day (BID) | ORAL | 0 refills | Status: AC

## 2024-06-30 MED ORDER — DAPAGLIFLOZIN PROPANEDIOL 10 MG PO TABS: 10.0000 mg | ORAL_TABLET | Freq: Every day | ORAL | 3 refills | Status: AC

## 2024-06-30 MED ORDER — METFORMIN HCL 500 MG PO TABS: ORAL_TABLET | ORAL | 1 refills | Status: AC

## 2024-06-30 NOTE — Progress Notes (Signed)
 Josephine Wooldridge Faizan Gokul Waybright , M.D. Gastroenterology & Hepatology [facility] [address] [place] ,[place] [address] Primary Care Physician: Vicci Barnie NOVAK, MD [address] [place] [address] [place] [address]  Chief Complaint:  Constipation  History of Present Illness: Laura Craig is a 68 y.o. female who presents for evaluation of Constipation   Patient reports having bowel movement once every 1 to 2 weeks on Bristol stool scale type I-IV The patient denies having any nausea, vomiting, fever, chills, hematochezia, melena, hematemesis, abdominal distention, abdominal pain, diarrhea, jaundice, pruritus or weight loss.  Past Medical History: Past Medical History:  Diagnosis Date   Arthritis    History of kidney stones    Hyperlipidemia    Left ureteral calculus    Nephrolithiasis    Type 2 diabetes mellitus (HCC)     Past Surgical History: Past Surgical History:  Procedure Laterality Date   CATARACT EXTRACTION W/ INTRAOCULAR LENS  IMPLANT, BILATERAL     CESAREAN SECTION     CYSTOSCOPY W/ RETROGRADES Left 05/27/2014   Procedure: CYSTOSCOPY WITH RETROGRADE PYELOGRAM;  Surgeon: Garnette CHRISTELLA Shack, MD;  Location: [facility];  Service: Urology;  Laterality: Left;   CYSTOSCOPY W/ URETERAL STENT PLACEMENT Left 05/03/2014   Procedure: CYSTOSCOPY WITH RETROGRADE PYELOGRAM/URETERAL STENT PLACEMENT;  Surgeon: Morene LELON Salines, MD;  Location: [facility];  Service: Urology;  Laterality: Left;   CYSTOSCOPY W/ URETERAL STENT PLACEMENT Left 05/27/2014   Procedure: CYSTOSCOPY WITH STENT REPLACEMENT;  Surgeon: Garnette CHRISTELLA Shack, MD;  Location: [facility];  Service: Urology;  Laterality: Left;   CYSTOSCOPY WITH URETEROSCOPY Left 05/27/2014   Procedure: CYSTOSCOPY WITH URETEROSCOPY/ STONE EXTRACTION;  Surgeon: Garnette CHRISTELLA Shack, MD;  Location: [facility];  Service: Urology;  Laterality: Left;   HOLMIUM LASER APPLICATION Left 05/27/2014    Procedure: HOLMIUM LASER APPLICATION;  Surgeon: Garnette CHRISTELLA Shack, MD;  Location: [facility];  Service: Urology;  Laterality: Left;   JOINT REPLACEMENT     LT Knee 2017   laser lithotripsy      Family History: Family History  Problem Relation Age of Onset   Diabetes Mother     Social History: Social History   Tobacco Use  Smoking Status Never  Smokeless Tobacco Never   Social History   Substance and Sexual Activity  Alcohol Use No   Social History   Substance and Sexual Activity  Drug Use No    Allergies: No Known Allergies  Medications: Current Outpatient Medications  Medication Sig Dispense Refill   polyethylene glycol (MIRALAX / GLYCOLAX) 17 g packet Take 17 g by mouth 2 (two) times daily. 180 packet 0   psyllium (METAMUCIL) 58.6 % packet Take 1 packet by mouth 2 (two) times daily. 60 packet 2   acetaminophen  (TYLENOL ) 325 MG tablet Take 650 mg by mouth every 6 (six) hours as needed for fever.     atorvastatin  (LIPITOR) 10 MG tablet Take 1 tablet (10 mg total) by mouth daily. 90 tablet 3   dapagliflozin propanediol (FARXIGA) 10 MG TABS tablet Take 1 tablet (10 mg total) by mouth daily. 90 tablet 3   doxycycline (VIBRA-TABS) 100 MG tablet Take half tab po every day for rash 20 tablet 0   ezetimibe (ZETIA) 10 MG tablet Take 1 tablet (10 mg total) by mouth daily. 90 tablet 3   insulin  glargine, 2 Unit Dial, (TOUJEO  MAX SOLOSTAR) 300 UNIT/ML Solostar Pen Take 40 t0 50 units after dinner every day WITH  NEEDLES 12 mL 3   Insulin  Pen Needle (PEN NEEDLES) 31G X 6 MM MISC Use as directed 100 each 6   losartan (COZAAR) 25 MG tablet Take 1 tablet (25 mg total) by mouth daily. 90 tablet 1   metFORMIN  (GLUCOPHAGE ) 500 MG tablet TAKE ONE TAB PO BID WITH FOOD 180 tablet 1   No current facility-administered medications for this visit.    Review of Systems: GENERAL: negative for malaise, night sweats HEENT: No changes in hearing or vision, no nose bleeds or  other nasal problems. NECK: Negative for lumps, goiter, pain and significant neck swelling RESPIRATORY: Negative for cough, wheezing CARDIOVASCULAR: Negative for chest pain, leg swelling, palpitations, orthopnea GI: SEE HPI MUSCULOSKELETAL: Negative for joint pain or swelling, back pain, and muscle pain. SKIN: Negative for lesions, rash HEMATOLOGY Negative for prolonged bleeding, bruising easily, and swollen nodes. ENDOCRINE: Negative for cold or heat intolerance, polyuria, polydipsia and goiter. NEURO: negative for tremor, gait imbalance, syncope and seizures. The remainder of the review of systems is noncontributory.   Physical Exam: BP 129/77 (BP Location: Left Arm, Patient Position: Sitting)   Pulse (!) 101   Temp (!) 97 F (36.1 C)   Ht 5' 2 (1.575 m)   Wt 183 lb 3.2 oz (83.1 kg)   SpO2 97%   BMI 33.51 kg/m  GENERAL: The patient is AO x3, in no acute distress. HEENT: Head is normocephalic and atraumatic. EOMI are intact. Mouth is well hydrated and without lesions. NECK: Supple. No masses LUNGS: Clear to auscultation. No presence of rhonchi/wheezing/rales. Adequate chest expansion HEART: RRR, normal s1 and s2. ABDOMEN: Soft, nontender, no guarding, no peritoneal signs, and nondistended. BS +. No masses.  Imaging/Labs: as above     Latest Ref Rng & Units 06/11/2024   12:13 PM 04/17/2018   10:45 AM 05/01/2014    5:00 PM  CBC  WBC 3.4 - 10.8 x10E3/uL 6.7  5.4  11.1   Hemoglobin 11.1 - 15.9 g/dL 87.8  87.8  89.3   Hematocrit 34.0 - 46.6 % 37.0  36.0  31.3   Platelets 150 - 450 x10E3/uL 318  355  312    No results found for: IRON, TIBC, FERRITIN  I personally reviewed and interpreted the available labs, imaging and endoscopic files.  Impression and Plan: Laura Craig is a 68 y.o. female who presents for evaluation of Constipation.    #Constipation  Patient has a sedentary lifestyle and diet is low in fiber and fluid  Ensure adequate fluid intake: Aim for 8  glasses of water daily. Follow a high fiber diet: Include foods such as dates, prunes, pears, and kiwi. Take Miralax twice a day for the first week, then reduce to once daily thereafter. Use Metamucil twice a day.  All questions were answered.      =================  The patient was evaluated today at the [facility] in [place]. Services were provided at no cost to the patient. This visit was part of our ongoing community outreach efforts to improve access to healthcare for underserved populations.   Aryka Coonradt Faizan Wilferd Ritson, MD Gastroenterology and Hepatology   This chart has been completed using [facility] Dictation software, and while attempts have been made to ensure accuracy , certain words and phrases may not be transcribed as intended

## 2024-06-30 NOTE — Progress Notes (Unsigned)
 [facility] [address] [place], [place] [address]  Internal MEDICINE  Office Visit Note  Patient Name: Laura Craig  [id number]  [id number]  Date of Service: 06/30/2024  Chief Complaint  Patient presents with   Follow-up    PT here for test results, reports of high blood sugar in the past week (<200) and chest pain, blood glucose today : 159    HPI     Current Medication: Outpatient Encounter Medications as of 06/30/2024  Medication Sig   acetaminophen  (TYLENOL ) 325 MG tablet Take 650 mg by mouth every 6 (six) hours as needed for fever.   doxycycline (VIBRA-TABS) 100 MG tablet Take half tab po every day for rash   ezetimibe (ZETIA) 10 MG tablet Take 1 tablet (10 mg total) by mouth daily.   Insulin  Pen Needle (PEN NEEDLES) 31G X 6 MM MISC Use as directed   losartan (COZAAR) 25 MG tablet Take 1 tablet (25 mg total) by mouth daily.   metFORMIN  (GLUCOPHAGE ) 1000 MG tablet Take 1 tablet (1,000 mg total) by mouth 2 (two) times daily with a meal.   No facility-administered encounter medications on file as of 06/30/2024.    Surgical History: Past Surgical History:  Procedure Laterality Date   CATARACT EXTRACTION W/ INTRAOCULAR LENS  IMPLANT, BILATERAL     CESAREAN SECTION     CYSTOSCOPY W/ RETROGRADES Left 05/27/2014   Procedure: CYSTOSCOPY WITH RETROGRADE PYELOGRAM;  Surgeon: Garnette CHRISTELLA Shack, MD;  Location: [facility];  Service: Urology;  Laterality: Left;   CYSTOSCOPY W/ URETERAL STENT PLACEMENT Left 05/03/2014   Procedure: CYSTOSCOPY WITH RETROGRADE PYELOGRAM/URETERAL STENT PLACEMENT;  Surgeon: Morene LELON Salines, MD;  Location: [facility];  Service: Urology;  Laterality: Left;   CYSTOSCOPY W/ URETERAL STENT PLACEMENT Left 05/27/2014   Procedure: CYSTOSCOPY WITH STENT REPLACEMENT;  Surgeon: Garnette CHRISTELLA Shack, MD;  Location: [facility];  Service: Urology;  Laterality: Left;   CYSTOSCOPY WITH URETEROSCOPY Left 05/27/2014   Procedure:  CYSTOSCOPY WITH URETEROSCOPY/ STONE EXTRACTION;  Surgeon: Garnette CHRISTELLA Shack, MD;  Location: [facility];  Service: Urology;  Laterality: Left;   HOLMIUM LASER APPLICATION Left 05/27/2014   Procedure: HOLMIUM LASER APPLICATION;  Surgeon: Garnette CHRISTELLA Shack, MD;  Location: [facility];  Service: Urology;  Laterality: Left;   JOINT REPLACEMENT     LT Knee 2017   laser lithotripsy      Medical History: Past Medical History:  Diagnosis Date   Arthritis    History of kidney stones    Hyperlipidemia    Left ureteral calculus    Nephrolithiasis    Type 2 diabetes mellitus (HCC)     Family History: Family History  Problem Relation Age of Onset   Diabetes Mother     Social History   Socioeconomic History   Marital status: Married    Spouse name: Not on file   Number of children: 6   Years of education: Not on file   Highest education level: Not on file  Occupational History   Occupation: Unemployed  Tobacco Use   Smoking status: Never   Smokeless tobacco: Never  Vaping Use   Vaping status: Never Used  Substance and Sexual Activity   Alcohol use: No   Drug use: No   Sexual activity: Not on file  Other Topics Concern   Not on file  Social History Narrative   Not on file   Social Drivers of Health   Financial Resource Strain: Not  on file  Food Insecurity: Not on file  Transportation Needs: Not on file  Physical Activity: Not on file  Stress: Not on file  Social Connections: Not on file  Intimate Partner Violence: Not on file      Review of Systems  Vital Signs: BP 129/77 (BP Location: Right Arm, Patient Position: Sitting, Cuff Size: Normal)   Pulse (!) 101   Temp (!) 97 F (36.1 C) (Temporal)   Ht 4' 11 (1.499 m)   Wt 183 lb 3.2 oz (83.1 kg)   SpO2 97%   BMI 37.00 kg/m    Physical Exam     Assessment/Plan:   General Counseling: Tinleigh verbalizes understanding of the findings of todays visit and agrees with plan of  treatment. I have discussed any further diagnostic evaluation that may be needed or ordered today. We also reviewed her medications today. she has been encouraged to call the office with any questions or concerns that should arise related to todays visit.    No orders of the defined types were placed in this encounter.   No orders of the defined types were placed in this encounter.   Total time spent:*** Minutes Time spent includes review of chart, medications, test results, and follow up plan with the patient.    Controlled Substance Database was reviewed by me.   Dr Larkyn Greenberger M Maeleigh Buschman Internal medicine

## 2024-07-01 ENCOUNTER — Ambulatory Visit: Payer: Self-pay | Admitting: Student in an Organized Health Care Education/Training Program

## 2024-07-01 VITALS — BP 129/77 | HR 101 | Temp 97.0°F | Ht 62.0 in | Wt 183.1 lb

## 2024-07-01 DIAGNOSIS — M778 Other enthesopathies, not elsewhere classified: Secondary | ICD-10-CM

## 2024-07-01 NOTE — Progress Notes (Signed)
 Laura Craig is a 68 year old female who presents with chronic bone pain in the hand and leg.  She has been experiencing persistent pain in her hand and right leg for over ten years. The pain has been managed with ibuprofen and Tylenol , which have not provided relief. 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  Over five years ago, she received an injection in her right knee while in [place], which did not provide lasting relief. She currently uses a bandage on the knee for support.  In addition to her leg pain, she experiences pain in her hand. An x-ray taken four to five months ago showed changes in the bone structure.  Chronic right knee pain   Pain has persisted for over ten years, with a history of left knee replacement. Current right knee pain is unresponsive to ibuprofen or acetaminophen . A previous injection was given over five years ago, and there has been no recent surgery on the right knee. An injection was administered in the right knee to alleviate pain, expected to provide relief for three to six months. 5 cc solution containing 4 cc Lido 1% and 1 cc of Kennalog 40 mg/cc  Chronic bilateral hand pain   Pain has persisted for over ten years, with increased bone density in the hands noted on an X-ray from four to five months ago. Pain is localized to specific areas. Injection done in hand as well.  Recommend Voltaren OTC to apply to bilateral hands

## 2024-07-28 ENCOUNTER — Encounter: Payer: Self-pay | Admitting: Orthopedic Surgery

## 2024-07-28 ENCOUNTER — Ambulatory Visit: Payer: Self-pay | Admitting: Orthopedic Surgery

## 2024-07-28 VITALS — BP 133/74 | HR 99 | Temp 98.2°F | Ht 59.0 in | Wt 183.0 lb

## 2024-07-28 DIAGNOSIS — M1711 Unilateral primary osteoarthritis, right knee: Secondary | ICD-10-CM

## 2024-07-28 MED ORDER — DICLOFENAC SODIUM 25 MG PO TBEC: 50.0000 mg | DELAYED_RELEASE_TABLET | Freq: Two times a day (BID) | ORAL | Status: AC

## 2024-07-28 NOTE — Progress Notes (Unsigned)
 S: R knee pain     H/o L TKR in 2018 in [place]     Patient had R knee IA injection cortisone 2 weeks ago.  O: Ambulatory with a cane       Palpble crepitus       Varus deformity A: R knee DJD P: Diclofanac sodium 50mg mg BID

## 2024-09-29 ENCOUNTER — Ambulatory Visit: Payer: Self-pay | Admitting: Internal Medicine
